# Patient Record
Sex: Male | Born: 1970 | Race: Black or African American | Hispanic: No | Smoking: Current every day smoker
Health system: Southern US, Community
[De-identification: ages and names within clinical notes are randomized; demographics above are authoritative.]

## PROBLEM LIST (undated history)

## (undated) DIAGNOSIS — L732 Hidradenitis suppurativa: Secondary | ICD-10-CM

## (undated) DIAGNOSIS — R079 Chest pain, unspecified: Secondary | ICD-10-CM

## (undated) DIAGNOSIS — I8390 Asymptomatic varicose veins of unspecified lower extremity: Secondary | ICD-10-CM

## (undated) DIAGNOSIS — M419 Scoliosis, unspecified: Secondary | ICD-10-CM

## (undated) DIAGNOSIS — F1910 Other psychoactive substance abuse, uncomplicated: Secondary | ICD-10-CM

## (undated) DIAGNOSIS — E162 Hypoglycemia, unspecified: Secondary | ICD-10-CM

## (undated) DIAGNOSIS — F1110 Opioid abuse, uncomplicated: Secondary | ICD-10-CM

## (undated) HISTORY — DX: Hidradenitis suppurativa: L73.2

## (undated) HISTORY — PX: OTHER SURGICAL HISTORY: SHX169

## (undated) HISTORY — DX: Scoliosis, unspecified: M41.9

## (undated) HISTORY — DX: Asymptomatic varicose veins of unspecified lower extremity: I83.90

---

## 2009-06-04 ENCOUNTER — Ambulatory Visit: Payer: Self-pay | Admitting: Radiology

## 2009-06-04 ENCOUNTER — Emergency Department (HOSPITAL_BASED_OUTPATIENT_CLINIC_OR_DEPARTMENT_OTHER): Admission: EM | Admit: 2009-06-04 | Discharge: 2009-06-04 | Payer: Self-pay | Admitting: Emergency Medicine

## 2011-10-28 ENCOUNTER — Encounter: Payer: Self-pay | Admitting: Internal Medicine

## 2011-12-05 ENCOUNTER — Encounter: Payer: Self-pay | Admitting: Internal Medicine

## 2012-05-07 ENCOUNTER — Ambulatory Visit: Payer: Self-pay

## 2012-05-07 ENCOUNTER — Ambulatory Visit (INDEPENDENT_AMBULATORY_CARE_PROVIDER_SITE_OTHER): Payer: PRIVATE HEALTH INSURANCE | Admitting: Internal Medicine

## 2012-05-07 ENCOUNTER — Encounter: Payer: Self-pay | Admitting: Internal Medicine

## 2012-05-07 VITALS — BP 123/73 | HR 76 | Temp 97.0°F | Ht 72.0 in | Wt 210.3 lb

## 2012-05-07 DIAGNOSIS — K029 Dental caries, unspecified: Secondary | ICD-10-CM

## 2012-05-07 DIAGNOSIS — M545 Low back pain, unspecified: Secondary | ICD-10-CM

## 2012-05-07 DIAGNOSIS — Z0000 Encounter for general adult medical examination without abnormal findings: Secondary | ICD-10-CM | POA: Insufficient documentation

## 2012-05-07 DIAGNOSIS — Z23 Encounter for immunization: Secondary | ICD-10-CM

## 2012-05-07 DIAGNOSIS — F17200 Nicotine dependence, unspecified, uncomplicated: Secondary | ICD-10-CM

## 2012-05-07 DIAGNOSIS — F17210 Nicotine dependence, cigarettes, uncomplicated: Secondary | ICD-10-CM | POA: Insufficient documentation

## 2012-05-07 DIAGNOSIS — M4120 Other idiopathic scoliosis, site unspecified: Secondary | ICD-10-CM

## 2012-05-07 MED ORDER — TRAMADOL HCL 50 MG PO TABS: 100.0000 mg | ORAL_TABLET | Freq: Four times a day (QID) | ORAL | Status: DC | PRN

## 2012-05-07 MED ORDER — OXYCODONE-ACETAMINOPHEN 10-325 MG PO TABS: 1.0000 | ORAL_TABLET | ORAL | Status: DC | PRN

## 2012-05-07 NOTE — Assessment & Plan Note (Signed)
  Assessment:  Progress toward smoking cessation:  smoking the same amount  Barriers to progress toward smoking cessation:  withdrawal symptoms  Comments:   Plan:  Instruction/counseling given:  I counseled patient on the dangers of tobacco use.  Educational resources provided:  smoking cessation handout (tips, strategies, fact sheets)  Self management tools provided:     Medications to assist with smoking cessation:  none prescibed today. Patient agreed to the following self-care plans for smoking cessation:  go to the [employer] ([email or web address])   Other: We will address this issues on his next visit.

## 2012-05-07 NOTE — Progress Notes (Signed)
Hurley ID: [name], male   DOB: 12-30-1970, 42 y.o.   MRN: [id number]  Subjective:   Hurley ID: Roger Hurley male   DOB: Aug 09, 1970 42 y.o.   MRN: [id number]  HPI: [name] is a 42 y.o. with past medical history only significant for scoliosis deformity of spine and cigarette smoking, presents to the clinic with back pain for over one year. He would also be interested in establishing care to this clinic.  He reports that his back pain is very severe, occurring several times in a week and associated with the nature of his work which involves lifting heavy objects. The Hurley however, denies symptoms of tingling, or weakness in his extremities. There are no symptoms of loss of fecal or urine control. The pain is dull, and when is worst is about an 8/10. His been using percocets 10 -325 mg about 10 tablets in a week for control his pain. He reports that his 'immune' to any other medications, including extra strength Tylenol, and NSAID's. It is unclear whether he has tried tramadol. He reports that as a teenager, he used to get back pains, which were thought to be related to his scoliosis and he underwent therapy by a chiropractic who informed him that his back had 'straightened up'. He actually reports that his back pain improved and he was discharged from therapy.   However, over the last 1 year he started working with a truck, which involved lifting heavy objects and his pain recurred. No specific history of trauma.    Past Medical History  Diagnosis Date  . Scoliosis deformity of spine     Since childhood   Current Outpatient Prescriptions  Medication Sig Dispense Refill  . oxyCODONE-acetaminophen (PERCOCET) 10-325 MG per tablet Take 1 tablet by mouth every 4 (four) hours as needed for pain.  30 tablet  0  . traMADol (ULTRAM) 50 MG tablet Take 2 tablets (100 mg total) by mouth every 6 (six) hours as needed for pain.  30 tablet  0   No current facility-administered medications  for this visit.   Family History  Problem Relation Age of Onset  . Diabetes Mother   . Hyperlipidemia Mother   . Hypertension Mother   . Diabetes Maternal Grandfather    History   Social History  . Marital Status: Legally Separated    Spouse Name: N/A    Number of Children: N/A  . 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 Smoking status: Current Every Day Smoker -- 0.50 packs/day    Types: Cigarettes  . Smokeless tobacco: Never Used     Comment: Has done PCP, Cocaine and Marijuana in the past.  . Alcohol Use: 3.6 oz/week    6 Cans of beer per week  . Drug Use: No  . Sexually Active: Yes     Comment: One girlfriend of 4 months. Use condoms for other relationships. Only with women   Other Topics Concern  . None   Social History Narrative   Divorced, three children of 50, 58 and 45 years of age in 2014.   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    Lives in his own apartment         Review of Systems: Constitutional: Denies fever, chills, diaphoresis, appetite change and fatigue.  HEENT: Denies photophobia, eye pain,  redness, hearing loss, ear pain, congestion, sore throat, rhinorrhea, sneezing, mouth sores, trouble swallowing, neck pain, neck stiffness and tinnitus.   Respiratory: Denies SOB, DOE, cough, chest tightness,  and wheezing.   Cardiovascular: Denies chest pain, palpitations and leg swelling.  Gastrointestinal: Denies nausea, vomiting, abdominal pain, diarrhea, constipation, blood in stool and abdominal distention.  Genitourinary: Denies dysuria, urgency, frequency, hematuria, flank pain and difficulty urinating.  Musculoskeletal: Denies myalgias, joint swelling, arthralgias and gait problem.  Skin: Denies pallor, rash and wound.  Neurological: Denies dizziness, seizures, syncope, weakness, light-headedness, numbness and headaches.  Hematological:  Denies adenopathy. Easy bruising, personal or family bleeding history  Psychiatric/Behavioral: Denies suicidal ideation, mood changes, confusion, nervousness, sleep disturbance and agitation  Objective:  Physical Exam: Filed Vitals:   05/07/12 1115  BP: 123/73  Pulse: 76  Temp: 97 F (36.1 C)  TempSrc: Oral  Height: 6' (1.829 m)  Weight: 210 lb 4.8 oz (95.391 kg)  SpO2: 100%   Constitutional: Vital signs reviewed.  Hurley is a well-developed and well-nourished in no acute distress and cooperative with exam. Alert and oriented x3.  Head: Normocephalic and atraumatic Ear: TM normal bilaterally Mouth: no erythema or exudates, MMM Eyes: PERRL, EOMI, conjunctivae normal, No scleral icterus.  Neck: Supple, Trachea midline normal ROM, No JVD, mass, thyromegaly, or carotid bruit present.  Cardiovascular: RRR, S1 normal, S2 normal, no MRG, pulses symmetric and intact bilaterally Pulmonary/Chest: CTAB, no wheezes, rales, or rhonchi Abdominal: Soft. Non-tender, non-distended, bowel sounds are normal, no masses, organomegaly, or guarding present.  GU: no CVA tenderness Musculoskeletal: There is evidence of scoliosis toward the right side of his back. No areas of tenderness. He is able to bend and touch his feet. No joint deformities, erythema, or stiffness, ROM full and no nontender Hematology: no cervical, inginal, or axillary adenopathy.  Neurological: A&O x3, Strength is normal and symmetric bilaterally, cranial nerve II-XII are grossly intact, no focal motor deficit, sensory intact to light touch bilaterally.  Skin: Warm, dry and intact. No rash, cyanosis, or clubbing.  he has tattoos on his extremities.  Psychiatric: Normal mood and affect. speech and behavior is normal. Judgment and thought content normal. Cognition and memory are normal.   Assessment & Plan:  I have discussed my assessment, and plan for the care of this Hurley with Dr. Dalphine Handing as detailed under in problem based  charting.  In the brief, his chronic back pain is very likely related to the nature of his work and scoliosis. This is his initial visit to this clinic. I have referred him to the physical therapy as outpatient and also given him a prescription of tramadol as a first-line treatment for his pain and 30 tablets of Percocet if the tramadol does not control his pain. I have counseled him about finding another job that will not affect his back pain. He will followup in one month.

## 2012-05-07 NOTE — Assessment & Plan Note (Addendum)
He reports sensitivity of his teeth to cold and sweet foods.  Plan. -Referral to dentist. He now has an orange card which gives him 60% discount.

## 2012-05-07 NOTE — Patient Instructions (Signed)
Please take Tramadol and if it does not help, then continue with Percocet We will refer you to Physical therapy for your low back pain  Please you are advised to change your job due to back pain  I will refer you to a dentist too Please come back to see me in one month and we see how you are doing

## 2012-05-07 NOTE — Assessment & Plan Note (Signed)
Updated his vaccinations. Ordered lipid panel as recommended for screening for hyperlipidemia.

## 2012-05-07 NOTE — Assessment & Plan Note (Signed)
His back pain, is chronic without red flags. Pain controlled with Percocet. The nature of his job exacerbates this pain.  Plan - I have discussed with the patient the need to change his job since his back pain is worsened by lifting heavy objects. - Referral physical therapy.  - We will start by trying tramadol 100 mg every 6 hours as needed for pain. Have also prescribed 30 tablets of Percocet 10 x 325 mg as needed for pain if the pain is not well controlled by tramadol. -Given his history of previous polysubstance abuse, I am hesitant to start him on long-term opioids. -consider referral to the pain clinic for further care.

## 2012-05-08 LAB — LIPID PANEL
HDL: 70 mg/dL (ref >39)
Total CHOL/HDL Ratio: 2.5 Ratio
Triglycerides: 51 mg/dL (ref <150)
VLDL: 10 mg/dL (ref 0–40)

## 2012-05-24 ENCOUNTER — Other Ambulatory Visit: Payer: Self-pay | Admitting: Internal Medicine

## 2012-05-24 ENCOUNTER — Other Ambulatory Visit: Payer: Self-pay | Admitting: *Deleted

## 2012-05-24 DIAGNOSIS — M545 Low back pain: Secondary | ICD-10-CM

## 2012-05-24 MED ORDER — OXYCODONE-ACETAMINOPHEN 10-325 MG PO TABS: 1.0000 | ORAL_TABLET | ORAL | Status: DC | PRN

## 2012-05-24 MED ORDER — TRAMADOL HCL 50 MG PO TABS: 100.0000 mg | ORAL_TABLET | Freq: Four times a day (QID) | ORAL | Status: DC | PRN

## 2012-05-24 NOTE — Progress Notes (Signed)
Patient called requesting for more prescription of percocet. He says that he only has two pills remaining. He is getting outpatient PT tomorrow at 8:30 am. I informed the patient that we need to try PT for his back pain and that I will not prescribe chronic narcotics at this time. I will initiate a referral to pain clinic as well. Patient verbalized understanding. I will see him at 06/09/2012.

## 2012-05-24 NOTE — Telephone Encounter (Signed)
Last refill of meds 2/21

## 2012-05-25 ENCOUNTER — Ambulatory Visit: Payer: PRIVATE HEALTH INSURANCE | Attending: Internal Medicine | Admitting: Physical Therapy

## 2012-05-25 DIAGNOSIS — M545 Low back pain, unspecified: Secondary | ICD-10-CM | POA: Insufficient documentation

## 2012-05-25 DIAGNOSIS — IMO0001 Reserved for inherently not codable concepts without codable children: Secondary | ICD-10-CM | POA: Insufficient documentation

## 2012-06-01 ENCOUNTER — Ambulatory Visit: Payer: PRIVATE HEALTH INSURANCE | Admitting: Physical Therapy

## 2012-06-09 ENCOUNTER — Encounter: Payer: Self-pay | Admitting: Internal Medicine

## 2012-06-09 ENCOUNTER — Encounter: Payer: Self-pay | Admitting: Physical Medicine & Rehabilitation

## 2012-06-09 ENCOUNTER — Ambulatory Visit (INDEPENDENT_AMBULATORY_CARE_PROVIDER_SITE_OTHER): Payer: PRIVATE HEALTH INSURANCE | Admitting: Internal Medicine

## 2012-06-09 VITALS — BP 152/73 | HR 73 | Temp 97.8°F | Ht 73.0 in | Wt 210.3 lb

## 2012-06-09 DIAGNOSIS — M545 Low back pain: Secondary | ICD-10-CM

## 2012-06-09 NOTE — Progress Notes (Signed)
Subjective:   Patient ID: Roger Hurley male   DOB: 06/21/1970 42 y.o.   MRN: [id number]  HPI: [name] is a 43 y.o. with past medical history only significant for scoliosis deformity of spine and cigarette smoking, presents for followup of his back pain. He reports that tramadol, which was prescribed during his last visit was unable to control his pain. He tried physical therapy 2 weeks ago and it did not help either. Actually he believes it made his pain worse. He requests for Percocet, so that he can go back to work. The pain is exacerbated by bending backward. He is unwilling to try any other medication besides Percocet since he has not received any relief in the past with his medications.  He has no other any complaints. I referred him to the pain clinic and he has an appointment on 08/13/2012. He also has an appointment in her outpatient physical therapy on 06/10/2012.   Past Medical History  Diagnosis Date  . Scoliosis deformity of spine     Since childhood   Current Outpatient Prescriptions  Medication Sig Dispense Refill  . traMADol (ULTRAM) 50 MG tablet Take 2 tablets (100 mg total) by mouth every 6 (six) hours as needed for pain.  30 tablet  0   No current facility-administered medications for this visit.   Family History  Problem Relation Age of Onset  . Diabetes Mother   . Hyperlipidemia Mother   . Hypertension Mother   . Diabetes Maternal Grandfather    History   Social History  . Marital Status: Divorced    Spouse Name: N/A    Number of Children: N/A  . 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 Smoking status: Current Every Day Smoker -- 0.50 packs/day    Types: Cigarettes  . Smokeless tobacco: Never Used     Comment: Has done PCP, Cocaine and Marijuana in the past.  . Alcohol Use: 3.6 oz/week    6 Cans of beer per week  . Drug Use: No  . Sexually Active: Yes      Comment: One girlfriend of 4 months. Use condoms for other relationships. Only with women   Other Topics Concern  . None   Social History Narrative   Divorced, three children of 52, 87 and 100 years of age in 2014.   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    Lives in his own apartment         Review of Systems: Constitutional: Denies fever, chills, diaphoresis, appetite change and fatigue.  HEENT: Denies photophobia, eye pain, redness, hearing loss, ear pain, congestion, sore throat, rhinorrhea, sneezing, mouth sores, trouble swallowing, neck pain, neck stiffness and tinnitus.   Respiratory: Denies SOB, DOE, cough, chest tightness,  and wheezing.   Cardiovascular: Denies chest pain, palpitations and leg swelling.  Gastrointestinal: Denies nausea, vomiting, abdominal pain, diarrhea, constipation, blood in stool and abdominal distention.  Genitourinary: Denies dysuria, urgency, frequency, hematuria, flank pain and difficulty urinating.  Musculoskeletal: Denies myalgias, joint swelling, arthralgias and gait problem.  Skin: Denies pallor, rash and wound.  Neurological: Denies dizziness, seizures, syncope, weakness, light-headedness, numbness and headaches.  Hematological: Denies adenopathy. Easy bruising, personal or family bleeding history  Psychiatric/Behavioral: Denies suicidal ideation, mood changes, confusion, nervousness, sleep disturbance and agitation  Objective:  Physical Exam: Filed Vitals:  06/09/12 1439  BP: 152/73  Pulse: 73  Temp: 97.8 F (36.6 C)  TempSrc: Oral  Height: 6\' 1"  (1.854 m)  Weight: 210 lb 4.8 oz (95.391 kg)  SpO2: 100%   Constitutional: Vital signs reviewed.  Patient is a well-developed and well-nourished in no acute distress and cooperative with exam. Alert and oriented x3.  Head: Normocephalic and atraumatic Ear: TM normal bilaterally Mouth: no erythema or exudates, MMM Eyes: PERRL, EOMI, conjunctivae normal, No scleral  icterus.  Neck: Supple, Trachea midline normal ROM, No JVD, mass, thyromegaly, or carotid bruit present.  Cardiovascular: RRR, S1 normal, S2 normal, no MRG, pulses symmetric and intact bilaterally Pulmonary/Chest: CTAB, no wheezes, rales, or rhonchi Abdominal: Soft. Non-tender, non-distended, bowel sounds are normal, no masses, organomegaly, or guarding present.  GU: no CVA tenderness Musculoskeletal: There is evidence of scoliosis toward the right side of his back. There is an area of tenderness over the right paraspinal muscles around the level T10-L4. The same area, also demonstrates muscle tightness. He is able to bend and touch his feet. No joint deformities, erythema, or stiffness, ROM full and no nontender Hematology: no cervical, inginal, or axillary adenopathy.  Neurological: A&O x3, Strength is normal and symmetric bilaterally, cranial nerve II-XII are grossly intact, no focal motor deficit, sensory intact to light touch bilaterally.  Skin: Warm, dry and intact. No rash, cyanosis, or clubbing.  he has tattoos on his extremities.  Psychiatric: Normal mood and affect. speech and behavior is normal. Judgment and thought content normal. Cognition and memory are normal.   Assessment & Plan:  I have discussed my assessment, and plan for the care of this patient with Dr. Josem Kaufmann, who personally evaluated the patient.     Several options for pain management were explored. He was offered a muscle relaxant, methocarbamol 500 mg by mouth every 8 hours as needed for pain in addition to salicylate 1500 mg every 12 hours as needed. This regimen was likely to help since his back pain is related to muscle cramps and rigidity in the setting of scoliosis. He was also informed that the next step if this does not help is injections with pain medication into his back. He was informed that this treatment plan is better than Percocet given the nature his pain. At that point, he became very upset and he declined  this plan. He demanded for percocet. He became verbally abusive, and left the clinic while cursing the medical staff including me and Dr Josem Kaufmann.

## 2012-06-09 NOTE — Progress Notes (Signed)
I interviewed and examined Roger Hurley with Dr. Zada Girt and confirmed his history of present illness.  My examination was notable for left (not right) lumbar paraspinous muscle tightness and pain.  His right lumbar paraspinous muscles were not tight or tender to palpation.  Given his history and his physical examination it was felt that he had acute left lumbar paraspinous muscle spasms likely related to his chronic scoliosis and possible underlying facet arthropathy.  The therapy of choice in this situation is to treat the active muscle spasms with a muscle relaxant and the facet arthropathy with an anti-inflammatory which he has not yet tried as he claims other NSAIDs have been ineffective for him.  He was not interested in that therapy (methocarbomol and salsalate) and demanded percocet.  I told him that the percocet was not appropriate therapy for his underlying spasm and inflammation at which point he became verbally belligerent to myself and Dr. Zada Girt.  We again reiterated that we were offering medication to treat his underlying disorder and he remained uninterested.  He left the clinic yelling down the hall and did not take the clinic director up on his offer to speak with him about his concerns.  I am concerned that Roger Hurley is not an appropriate patient for our clinic given his demonstrated verbally abusive behavior when his demands for narcotics are not met.  I do not feel we will be able to form a therapeutic relationship that will allow [place] to appropriately manage Roger Hurley's back pain.  I have asked the medical director to proceed with a formal letter to Roger Hurley notifying him of the need to find another Primary Care Provider within the next thirty days.  We will continue to provide him with appropriate medical care for the next thirty days while he looks for continuing care.  I will also discuss this with the clinic medical director.

## 2012-06-10 ENCOUNTER — Encounter: Payer: Self-pay | Admitting: Internal Medicine

## 2012-06-10 ENCOUNTER — Ambulatory Visit: Payer: PRIVATE HEALTH INSURANCE | Admitting: Physical Therapy

## 2012-06-15 ENCOUNTER — Ambulatory Visit: Payer: PRIVATE HEALTH INSURANCE | Admitting: Physical Therapy

## 2012-06-22 ENCOUNTER — Encounter: Payer: PRIVATE HEALTH INSURANCE | Admitting: Physical Therapy

## 2012-06-25 ENCOUNTER — Encounter: Payer: Self-pay | Admitting: Internal Medicine

## 2012-07-01 ENCOUNTER — Inpatient Hospital Stay (HOSPITAL_BASED_OUTPATIENT_CLINIC_OR_DEPARTMENT_OTHER)
Admission: EM | Admit: 2012-07-01 | Discharge: 2012-07-02 | DRG: 918 | Disposition: A | Discharge status: Home / Self Care | Payer: PRIVATE HEALTH INSURANCE | Attending: Internal Medicine | Admitting: Internal Medicine

## 2012-07-01 ENCOUNTER — Encounter (HOSPITAL_BASED_OUTPATIENT_CLINIC_OR_DEPARTMENT_OTHER): Payer: Self-pay | Admitting: Emergency Medicine

## 2012-07-01 ENCOUNTER — Emergency Department (HOSPITAL_BASED_OUTPATIENT_CLINIC_OR_DEPARTMENT_OTHER): Payer: PRIVATE HEALTH INSURANCE

## 2012-07-01 DIAGNOSIS — M549 Dorsalgia, unspecified: Secondary | ICD-10-CM

## 2012-07-01 DIAGNOSIS — M4120 Other idiopathic scoliosis, site unspecified: Secondary | ICD-10-CM | POA: Diagnosis present

## 2012-07-01 DIAGNOSIS — F1910 Other psychoactive substance abuse, uncomplicated: Secondary | ICD-10-CM

## 2012-07-01 DIAGNOSIS — T405X1A Poisoning by cocaine, accidental (unintentional), initial encounter: Principal | ICD-10-CM | POA: Diagnosis present

## 2012-07-01 DIAGNOSIS — F17200 Nicotine dependence, unspecified, uncomplicated: Secondary | ICD-10-CM | POA: Diagnosis present

## 2012-07-01 DIAGNOSIS — E162 Hypoglycemia, unspecified: Secondary | ICD-10-CM | POA: Diagnosis present

## 2012-07-01 DIAGNOSIS — R0789 Other chest pain: Secondary | ICD-10-CM | POA: Diagnosis present

## 2012-07-01 DIAGNOSIS — R079 Chest pain, unspecified: Secondary | ICD-10-CM | POA: Diagnosis present

## 2012-07-01 DIAGNOSIS — R001 Bradycardia, unspecified: Secondary | ICD-10-CM | POA: Diagnosis present

## 2012-07-01 DIAGNOSIS — I498 Other specified cardiac arrhythmias: Secondary | ICD-10-CM

## 2012-07-01 DIAGNOSIS — G8929 Other chronic pain: Secondary | ICD-10-CM

## 2012-07-01 HISTORY — DX: Hypoglycemia, unspecified: E16.2

## 2012-07-01 HISTORY — DX: Chest pain, unspecified: R07.9

## 2012-07-01 LAB — COMPREHENSIVE METABOLIC PANEL
BUN: 15 mg/dL (ref 6–23)
CO2: 29 mEq/L (ref 19–32)
Calcium: 9.8 mg/dL (ref 8.4–10.5)
GFR calc Af Amer: 79 mL/min — ABNORMAL LOW (ref >90)
GFR calc non Af Amer: 68 mL/min — ABNORMAL LOW (ref >90)
Glucose, Bld: 98 mg/dL (ref 70–99)
Total Protein: 7.3 g/dL (ref 6.0–8.3)

## 2012-07-01 LAB — URINALYSIS, ROUTINE W REFLEX MICROSCOPIC
Bilirubin Urine: NEGATIVE
Glucose, UA: NEGATIVE mg/dL
Ketones, ur: NEGATIVE mg/dL
Leukocytes, UA: NEGATIVE
Nitrite: NEGATIVE
Specific Gravity, Urine: 1.035 — ABNORMAL HIGH (ref 1.005–1.030)
pH: 6 (ref 5.0–8.0)

## 2012-07-01 LAB — D-DIMER, QUANTITATIVE: D-Dimer, Quant: <0.27 ug/mL-FEU (ref 0.00–0.48)

## 2012-07-01 LAB — CBC
Hemoglobin: 14.6 g/dL (ref 13.0–17.0)
MCHC: 35.1 g/dL (ref 30.0–36.0)
Platelets: 210 10*3/uL (ref 150–400)
RDW: 12.8 % (ref 11.5–15.5)
WBC: 8.3 10*3/uL (ref 4.0–10.5)

## 2012-07-01 MED ORDER — KETOROLAC TROMETHAMINE 30 MG/ML IJ SOLN
30.0000 mg | Freq: Once | INTRAMUSCULAR | Status: AC
  Administered 2012-07-02: 30 mg via INTRAVENOUS
  Filled 2012-07-01: qty 1

## 2012-07-01 MED ORDER — ASPIRIN 325 MG PO TABS
325.0000 mg | ORAL_TABLET | Freq: Once | ORAL | Status: AC
  Administered 2012-07-02: 325 mg via ORAL
  Filled 2012-07-01: qty 1

## 2012-07-01 NOTE — ED Notes (Signed)
Upper mid chest pain since 0730. Worse with movement. Denies radiation.  SOB.  Worse when blowing nose.  Has seasonal allergies.

## 2012-07-01 NOTE — ED Notes (Signed)
MD at bedside. 

## 2012-07-02 ENCOUNTER — Encounter (HOSPITAL_BASED_OUTPATIENT_CLINIC_OR_DEPARTMENT_OTHER): Payer: Self-pay | Admitting: Emergency Medicine

## 2012-07-02 DIAGNOSIS — R079 Chest pain, unspecified: Secondary | ICD-10-CM

## 2012-07-02 DIAGNOSIS — I379 Nonrheumatic pulmonary valve disorder, unspecified: Secondary | ICD-10-CM

## 2012-07-02 DIAGNOSIS — R001 Bradycardia, unspecified: Secondary | ICD-10-CM | POA: Diagnosis present

## 2012-07-02 DIAGNOSIS — M549 Dorsalgia, unspecified: Secondary | ICD-10-CM | POA: Diagnosis present

## 2012-07-02 DIAGNOSIS — F1910 Other psychoactive substance abuse, uncomplicated: Secondary | ICD-10-CM | POA: Diagnosis present

## 2012-07-02 LAB — MAGNESIUM: Magnesium: 2.3 mg/dL (ref 1.5–2.5)

## 2012-07-02 LAB — BASIC METABOLIC PANEL
BUN: 14 mg/dL (ref 6–23)
Calcium: 9.4 mg/dL (ref 8.4–10.5)
Chloride: 102 mEq/L (ref 96–112)
GFR calc Af Amer: 76 mL/min — ABNORMAL LOW (ref >90)
GFR calc non Af Amer: 65 mL/min — ABNORMAL LOW (ref >90)
Glucose, Bld: 91 mg/dL (ref 70–99)
Potassium: 4 mEq/L (ref 3.5–5.1)
Sodium: 137 mEq/L (ref 135–145)

## 2012-07-02 LAB — RAPID URINE DRUG SCREEN, HOSP PERFORMED
Benzodiazepines: NOT DETECTED
Cocaine: POSITIVE — AB
Opiates: NOT DETECTED

## 2012-07-02 LAB — PROTIME-INR
INR: 1 (ref 0.00–1.49)
Prothrombin Time: 13.1 seconds (ref 11.6–15.2)

## 2012-07-02 LAB — HEMOGLOBIN A1C: Mean Plasma Glucose: 117 mg/dL — ABNORMAL HIGH (ref <117)

## 2012-07-02 LAB — TSH: TSH: 0.669 u[IU]/mL (ref 0.350–4.500)

## 2012-07-02 MED ORDER — ACETAMINOPHEN 325 MG PO TABS: 650.0000 mg | ORAL_TABLET | Freq: Four times a day (QID) | ORAL | Status: DC | PRN

## 2012-07-02 MED ORDER — VITAMIN B-1 100 MG PO TABS
100.0000 mg | ORAL_TABLET | Freq: Every day | ORAL | Status: DC
  Administered 2012-07-02: 100 mg via ORAL
  Filled 2012-07-02: qty 1

## 2012-07-02 MED ORDER — SODIUM CHLORIDE 0.9 % IJ SOLN
3.0000 mL | Freq: Two times a day (BID) | INTRAMUSCULAR | Status: DC
Start: 2012-07-02 — End: 2012-07-02

## 2012-07-02 MED ORDER — SODIUM CHLORIDE 0.9 % IJ SOLN: 3.0000 mL | INTRAMUSCULAR | Status: DC | PRN

## 2012-07-02 MED ORDER — FOLIC ACID 1 MG PO TABS
1.0000 mg | ORAL_TABLET | Freq: Every day | ORAL | Status: DC
  Administered 2012-07-02: 1 mg via ORAL
  Filled 2012-07-02 (×2): qty 1

## 2012-07-02 MED ORDER — SODIUM CHLORIDE 0.9 % IJ SOLN: 3.0000 mL | Freq: Two times a day (BID) | INTRAMUSCULAR | Status: DC

## 2012-07-02 MED ORDER — MORPHINE SULFATE 2 MG/ML IJ SOLN: 2.0000 mg | INTRAMUSCULAR | Status: DC | PRN

## 2012-07-02 MED ORDER — SODIUM CHLORIDE 0.9 % IV SOLN: 250.0000 mL | INTRAVENOUS | Status: DC | PRN

## 2012-07-02 MED ORDER — TRAMADOL HCL 50 MG PO TABS: 100.0000 mg | ORAL_TABLET | Freq: Four times a day (QID) | ORAL | Status: DC | PRN

## 2012-07-02 MED ORDER — ASPIRIN EC 81 MG PO TBEC
81.0000 mg | DELAYED_RELEASE_TABLET | Freq: Every day | ORAL | Status: DC
  Administered 2012-07-02: 81 mg via ORAL
  Filled 2012-07-02: qty 1

## 2012-07-02 MED ORDER — LORAZEPAM 2 MG/ML IJ SOLN
1.0000 mg | Freq: Once | INTRAMUSCULAR | Status: AC
  Administered 2012-07-02: 1 mg via INTRAVENOUS
  Filled 2012-07-02: qty 1

## 2012-07-02 MED ORDER — HEPARIN SODIUM (PORCINE) 5000 UNIT/ML IJ SOLN
5000.0000 [IU] | Freq: Three times a day (TID) | INTRAMUSCULAR | Status: DC
  Administered 2012-07-02: 5000 [IU] via SUBCUTANEOUS
  Filled 2012-07-02 (×4): qty 1

## 2012-07-02 MED ORDER — ACETAMINOPHEN 650 MG RE SUPP: 650.0000 mg | Freq: Four times a day (QID) | RECTAL | Status: DC | PRN

## 2012-07-02 MED ORDER — LORAZEPAM 2 MG/ML IJ SOLN: 0.5000 mg | Freq: Four times a day (QID) | INTRAMUSCULAR | Status: DC | PRN

## 2012-07-02 MED ORDER — NITROGLYCERIN 0.4 MG SL SUBL: 0.4000 mg | SUBLINGUAL_TABLET | SUBLINGUAL | Status: DC | PRN

## 2012-07-02 MED ORDER — SODIUM CHLORIDE 0.9 % IV SOLN
INTRAVENOUS | Status: DC
  Administered 2012-07-02: 06:00:00 via INTRAVENOUS

## 2012-07-02 MED ORDER — THIAMINE HCL 100 MG PO TABS: 100.0000 mg | ORAL_TABLET | Freq: Every day | ORAL | Status: DC

## 2012-07-02 NOTE — ED Notes (Signed)
Attempted to call report to floor, RN unavailable, number left for return call

## 2012-07-02 NOTE — Progress Notes (Signed)
*  PRELIMINARY RESULTS* Echocardiogram 2D Echocardiogram has been performed.  Roger Hurley 07/02/2012, 1:40 PM

## 2012-07-02 NOTE — Progress Notes (Signed)
D/c orders received;IV removed with gauze on, pt remains in stable condition, pt meds and instructions reviewed and given to pt; pt d/c to home 
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
## 2012-07-02 NOTE — Progress Notes (Signed)
Utilization review completed. Scottlyn Mchaney, RN, BSN. 

## 2012-07-02 NOTE — ED Notes (Signed)
ekg repeated per md, ekg given to md

## 2012-07-02 NOTE — H&P (Signed)
Internal Medicine Attending Admission Note Date: 07/02/2012  Patient name: Roger Hurley Medical record number: [id number] Date of birth: 11/09/1970 Age: 42 y.o. Gender: male  I saw and evaluated the patient. I reviewed the resident's note and I agree with the resident's findings and plan as documented in the resident's note.  Chief Complaint(s): Chest pain  History - key components related to admission: Patient is a 42 year old man with past medical history most significant for ongoing substance abuse comes in with chief complaints of chest pain. The description of chest pain, aggravating and relieving factors makes it atypical for coronary artery disease related angina.   Patient is lethargic but denies any shortness of breath, dizziness, syncope, presyncope at this time. He is eating lunch at the time of my visit and denies any ongoing chest pain. Patient admits that he used cocaine 2 days ago.  15 point review of system is negative except what is noted above.  Physical Exam - key components related to admission:  Filed Vitals:   07/02/12 0911 07/02/12 0914 07/02/12 0917 07/02/12 0920  BP: 137/79 125/73 142/81 127/86  Pulse: 58 62 82 72  Temp:      TempSrc:      Resp:      Height:      Weight:      SpO2: 94% 98% 98% 98%  Physical Exam: General: Vital signs reviewed and noted. Well-developed, well-nourished, in no acute distress; alert, appropriate and cooperative throughout examination.  Head: Normocephalic, atraumatic.  Eyes: PERRL, EOMI, No signs of anemia or jaundince.  Nose: Mucous membranes moist, not inflammed, nonerythematous.  Throat: Oropharynx nonerythematous, no exudate appreciated.   Neck: No deformities, masses, or tenderness noted.Supple, No carotid Bruits, no JVD.  Lungs:  Normal respiratory effort. Clear to auscultation BL without crackles or wheezes.  Heart: RRR. S1 and S2 normal without gallop, murmur, or rubs.  Abdomen:  BS normoactive. Soft, Nondistended,  non-tender.  No masses or organomegaly.  Extremities: No pretibial edema.  Neurologic: A&O X3, CN II - XII are grossly intact. Motor strength is 5/5 in the all 4 extremities, Sensations intact to light touch, Cerebellar signs negative.  Skin: No visible rashes, scars.     Lab results:   Basic Metabolic Panel:  Recent Labs  78/29/56 2305 07/02/12 0615  NA 138 137  K 4.3 4.0  CL 100 102  CO2 29 30  GLUCOSE 98 91  BUN 15 14  CREATININE 1.30 1.34  CALCIUM 9.8 9.4  MG  --  2.3  PHOS  --  4.0   Liver Function Tests:  Recent Labs  07/01/12 2305  AST 31  ALT 16  ALKPHOS 64  BILITOT 0.2*  PROT 7.3  ALBUMIN 3.8   CBC:  Recent Labs  07/01/12 2305  WBC 8.3  HGB 14.6  HCT 41.6  MCV 91.2  PLT 210   Cardiac Enzymes:  Recent Labs  07/01/12 2305 07/02/12 0243 07/02/12 0752  CKTOTAL  --  881*  --   CKMB  --  5.5*  --   TROPONINI <0.30 <0.30 <0.30   D-Dimer:  Recent Labs  07/01/12 2305  DDIMER <0.27   Coagulation:  Recent Labs  07/02/12 0615  INR 1.00    Imaging results:  Dg Chest [facility] 1 View  07/01/2012  *RADIOLOGY REPORT*  Clinical Data: Chest pain.  PORTABLE CHEST - 1 VIEW  Comparison: None.  Findings: Heart size and pulmonary vascularity are normal and the lungs are clear.  Moderately severe thoracic scoliosis.  No acute osseous abnormality.  IMPRESSION: No acute abnormality.  Moderately severe thoracic scoliosis.   Original Report Authenticated By: Francene Boyers, M.D.     Other results: EKG: Sinus bradycardia with no ST and T-wave changes  Assessment & Plan by Problem:  Principal Problem:   Chest pain Active Problems:   Bradycardia   Polysubstance abuse   Chronic back pain  Patient is a 42 year old man with past medical history most significant for substance abuse who comes in with acute onset chest pain which is atypical in character. Chest pain is most likely related to cocaine. Patient tested positive for cocaine on urine drug screen  during this admission. Chest pain could also be musculoskeletal in origin. Management of cocaine-induced chest pain includes benzodiazepines and calcium channel blockers(not given as patient has bradycardia).   Patient has asymptomatic bradycardia at this time in the setting of ongoing cocaine use. There have been multiple case reports in the literature regarding cocaine induced bradyarrhythmias due to conduction defects caused by chronic cocaine use. Patient will be advised to discontinue cocaine use to minimize destruction of his electrical conduction system from it.   Cardiology was consulted overnight and we appreciate their help.  2-D echocardiogram is pending at this time. If 2-D echocardiogram is normal, patient will be discharged home without need of any further workup as per cardiology recommendations.   Lars Mage MD Faculty-Internal Medicine Residency Program

## 2012-07-02 NOTE — Discharge Summary (Signed)
Internal Medicine Teaching [facility] Discharge Note  Name: Roger Hurley MRN: [id number] DOB: [birth year] 42 y.o.  Date of Admission: 07/01/2012 10:59 PM Date of Discharge: 07/02/2012 Attending Physician: Burns Spain, MD  Discharge Diagnosis: Principal Problem:   Chest pain Active Problems:   Bradycardia   Polysubstance abuse   Chronic back pain   Discharge Medications:   Medication List    ASK your doctor about these medications       oxyCODONE-acetaminophen 10-325 MG per tablet  Commonly known as:  PERCOCET  Take 2 tablets by mouth every 4 (four) hours as needed for pain.        Disposition and follow-up:   Roger Hurley was discharged from [facility] in Stable condition.  At the hospital follow up visit please address the following:  -cocaine abuse counseling -follow up chest pain -follow up bradycardia and ask about symptoms   Follow-up Appointments:  Discharge Orders   Future Appointments Provider Department Dept Phone   08/13/2012 11:30 AM Erick Colace, MD [phone]   Future Orders Complete By Expires     Diet - low sodium heart healthy  As directed     Discharge instructions  As directed     Scheduling Instructions:      Discharge patient to home when he is done getting his ECHO study. Thanks    Increase activity slowly  As directed        Consultations: Treatment Team:  Rounding Lbcardiology, MD  Procedures Performed:  Dg Chest Port 1 View  07/01/2012  *RADIOLOGY REPORT*  Clinical Data: Chest pain.  PORTABLE CHEST - 1 VIEW  Comparison: None.  Findings: Heart size and pulmonary vascularity are normal and the lungs are clear.  Moderately severe thoracic scoliosis.  No acute osseous abnormality.  IMPRESSION: No acute abnormality.  Moderately severe thoracic scoliosis.   Original Report Authenticated By: Francene Boyers, M.D.     2D Echo:  Transthoracic  Echocardiography  Patient: Roger, Hurley MR #: [id number] Study Date: 07/02/2012  Study Conclusions  - Left ventricle: The cavity size was normal. Wall thickness was increased in a pattern of mild LVH. Systolic function was normal. The estimated ejection fraction was in the range of 60% to 65%. Wall motion was normal; there were no regional wall motion abnormalities. The study is not technically sufficient to allow evaluation of LV diastolic function. - Atrial septum: No defect or patent foramen ovale was identified.  Admission HPI: 42 y.o PMH scoliosis, hypoglycemia presented to [facility] with new onset chest pain starting at 7 or 730 am on 07/01/12. Chest pain is worse with movement and blowing his nose and associated with shortness of breath, sweating, and dizziness which all associated symptoms have resolved. Chest pain/tightness was centrally located radiating to entire chest and feels like it is traveling toward his back. Sensation is like something sitting on his chest. Chest pain level varied 4-8/10. Chest pain is still present on admission. Nothing made better except medications given at outside [facility] Toradol 30 mg iv x 1, Ativan 1 mg iv and and Asprin 325mg . At [facility] BP was 146/65 HR 80 RR 18 100% on room air with bradycardiac down to 55. He had a total of 3 EKG there. Initial EKG sinus bradycardiac T wave inversion AVL, peaked anterolateral T waves. Second EKG Sinus bradycardia T wave inversion in V2, peaked T wave in lateral leads, Q wave in V2. Third  EKG NSR peaked T waves lateral leads and AVF, T wave inversion in V2, Q wave in V2.    Hospital Course by problem list: Principal Problem:   Chest pain Active Problems:   Bradycardia   Polysubstance abuse   Chronic back pain   Chest pain in the setting of cocaine use  Patient presented with atypical chest pain shortly after using cocaine. Vital signs were stable on admission. Chest x-ray  was negative for acute changes, d-dimer checked in the ED was negative. EKG was significant for sinus bradycardia without acute ST elevations or depressions, no T wave inversions. Troponins were cycled and were negative. Patient was given nitroglycerin, benzodiazepine, morphine, oxygen, aspirin. UDS positive for cocaine, therefore, beta blockers were avoided. Calcium channel blockers were also avoided in the setting of bradycardia. 2-D echo was performed which showed mild LVH pattern thickening, EF 65%, no regional wall motion abnormalities, poor diastolic evaluation. Cardiology was consulted and evaluated the patient. Bradycardia was thought to represent vagal tone and his chest pain was thought to be musculoskeletal in origin versus cocaine related. No further workup was felt to be needed at this time.  Sinus Bradycardia Sinus bradycardia noted on his EKG. Patient is asymptomatic and cardiology felt that no further workup is necessary. Continue to monitor this and followup as an outpatient.   Discharge Vitals:  BP 127/86  Pulse 72  Temp(Src) 97.4 F (36.3 C) (Oral)  Resp 18  Ht 6\' 1"  (1.854 m)  Wt 203 lb 1.6 oz (92.126 kg)  BMI 26.8 kg/m2  SpO2 98%  Discharge Labs:  Results for orders placed during the hospital encounter of 07/01/12 (from the past 24 hour(s))  COMPREHENSIVE METABOLIC PANEL     Status: Abnormal   Collection Time    07/01/12 11:05 PM      Result Value Range   Sodium 138  135 - 145 mEq/L   Potassium 4.3  3.5 - 5.1 mEq/L   Chloride 100  96 - 112 mEq/L   CO2 29  19 - 32 mEq/L   Glucose, Bld 98  70 - 99 mg/dL   BUN 15  6 - 23 mg/dL   Creatinine, Ser 4.78  0.50 - 1.35 mg/dL   Calcium 9.8  8.4 - 29.5 mg/dL   Total Protein 7.3  6.0 - 8.3 g/dL   Albumin 3.8  3.5 - 5.2 g/dL   AST 31  0 - 37 U/L   ALT 16  0 - 53 U/L   Alkaline Phosphatase 64  39 - 117 U/L   Total Bilirubin 0.2 (*) 0.3 - 1.2 mg/dL   GFR calc non Af Amer 68 (*) >90 mL/min   GFR calc Af Amer 79 (*) >90  mL/min  CBC     Status: None   Collection Time    07/01/12 11:05 PM      Result Value Range   WBC 8.3  4.0 - 10.5 K/uL   RBC 4.56  4.22 - 5.81 MIL/uL   Hemoglobin 14.6  13.0 - 17.0 g/dL   HCT 62.1  30.8 - 65.7 %   MCV 91.2  78.0 - 100.0 fL   MCH 32.0  26.0 - 34.0 pg   MCHC 35.1  30.0 - 36.0 g/dL   RDW 84.6  96.2 - 95.2 %   Platelets 210  150 - 400 K/uL  TROPONIN I     Status: None   Collection Time    07/01/12 11:05 PM      Result  Value Range   Troponin I <0.30  <0.30 ng/mL  D-DIMER, QUANTITATIVE     Status: None   Collection Time    07/01/12 11:05 PM      Result Value Range   D-Dimer, Quant <0.27  0.00 - 0.48 ug/mL-FEU  URINALYSIS, ROUTINE W REFLEX MICROSCOPIC     Status: Abnormal   Collection Time    07/01/12 11:34 PM      Result Value Range   Color, Urine YELLOW  YELLOW   APPearance CLEAR  CLEAR   Specific Gravity, Urine 1.035 (*) 1.005 - 1.030   pH 6.0  5.0 - 8.0   Glucose, UA NEGATIVE  NEGATIVE mg/dL   Hgb urine dipstick NEGATIVE  NEGATIVE   Bilirubin Urine NEGATIVE  NEGATIVE   Ketones, ur NEGATIVE  NEGATIVE mg/dL   Protein, ur NEGATIVE  NEGATIVE mg/dL   Urobilinogen, UA 0.2  0.0 - 1.0 mg/dL   Nitrite NEGATIVE  NEGATIVE   Leukocytes, UA NEGATIVE  NEGATIVE  URINE RAPID DRUG SCREEN (HOSP PERFORMED)     Status: Abnormal   Collection Time    07/01/12 11:35 PM      Result Value Range   Opiates NONE DETECTED  NONE DETECTED   Cocaine POSITIVE (*) NONE DETECTED   Benzodiazepines NONE DETECTED  NONE DETECTED   Amphetamines NONE DETECTED  NONE DETECTED   Tetrahydrocannabinol NONE DETECTED  NONE DETECTED   Barbiturates NONE DETECTED  NONE DETECTED  TROPONIN I     Status: None   Collection Time    07/02/12  2:43 AM      Result Value Range   Troponin I <0.30  <0.30 ng/mL  CK TOTAL AND CKMB     Status: Abnormal   Collection Time    07/02/12  2:43 AM      Result Value Range   Total CK 881 (*) 7 - 232 U/L   CK, MB 5.5 (*) 0.3 - 4.0 ng/mL   Relative Index 0.6  0.0 -  2.5  BASIC METABOLIC PANEL     Status: Abnormal   Collection Time    07/02/12  6:15 AM      Result Value Range   Sodium 137  135 - 145 mEq/L   Potassium 4.0  3.5 - 5.1 mEq/L   Chloride 102  96 - 112 mEq/L   CO2 30  19 - 32 mEq/L   Glucose, Bld 91  70 - 99 mg/dL   BUN 14  6 - 23 mg/dL   Creatinine, Ser 4.54  0.50 - 1.35 mg/dL   Calcium 9.4  8.4 - 09.8 mg/dL   GFR calc non Af Amer 65 (*) >90 mL/min   GFR calc Af Amer 76 (*) >90 mL/min  MAGNESIUM     Status: None   Collection Time    07/02/12  6:15 AM      Result Value Range   Magnesium 2.3  1.5 - 2.5 mg/dL  PHOSPHORUS     Status: None   Collection Time    07/02/12  6:15 AM      Result Value Range   Phosphorus 4.0  2.3 - 4.6 mg/dL  PROTIME-INR     Status: None   Collection Time    07/02/12  6:15 AM      Result Value Range   Prothrombin Time 13.1  11.6 - 15.2 seconds   INR 1.00  0.00 - 1.49  TSH     Status: None   Collection Time  07/02/12  6:15 AM      Result Value Range   TSH 0.669  0.350 - 4.500 uIU/mL  HEMOGLOBIN A1C     Status: Abnormal   Collection Time    07/02/12  6:15 AM      Result Value Range   Hemoglobin A1C 5.7 (*) <5.7 %   Mean Plasma Glucose 117 (*) <117 mg/dL  TROPONIN I     Status: None   Collection Time    07/02/12  7:52 AM      Result Value Range   Troponin I <0.30  <0.30 ng/mL    Signed: Denton Ar 07/02/2012, 1:07 PM   Time Spent on Discharge: 25 minutes Services Ordered on Discharge: none Equipment Ordered on Discharge: none
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
## 2012-07-02 NOTE — ED Notes (Signed)
Patient transported to X-ray, pt moving both extremities equally while being transported to xray

## 2012-07-02 NOTE — ED Notes (Signed)
Report given to [facility] 
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
## 2012-08-13 ENCOUNTER — Ambulatory Visit: Payer: PRIVATE HEALTH INSURANCE | Admitting: Physical Medicine & Rehabilitation

## 2012-08-30 ENCOUNTER — Ambulatory Visit: Payer: PRIVATE HEALTH INSURANCE | Admitting: Physical Medicine & Rehabilitation

## 2012-08-30 ENCOUNTER — Ambulatory Visit: Payer: PRIVATE HEALTH INSURANCE

## 2012-10-22 ENCOUNTER — Ambulatory Visit: Payer: PRIVATE HEALTH INSURANCE | Admitting: Physical Medicine & Rehabilitation

## 2012-10-22 NOTE — Addendum Note (Signed)
Addended by: Neomia Dear on: 10/22/2012 03:01 PM   Modules accepted: Orders

## 2012-11-22 ENCOUNTER — Ambulatory Visit: Payer: Self-pay

## 2012-12-06 ENCOUNTER — Ambulatory Visit: Payer: Self-pay

## 2013-02-12 ENCOUNTER — Emergency Department (HOSPITAL_BASED_OUTPATIENT_CLINIC_OR_DEPARTMENT_OTHER)
Admission: EM | Admit: 2013-02-12 | Discharge: 2013-02-12 | Disposition: A | Discharge status: Home / Self Care | Payer: Self-pay | Attending: Emergency Medicine | Admitting: Emergency Medicine

## 2013-02-12 ENCOUNTER — Encounter (HOSPITAL_BASED_OUTPATIENT_CLINIC_OR_DEPARTMENT_OTHER): Payer: Self-pay | Admitting: Emergency Medicine

## 2013-02-12 ENCOUNTER — Emergency Department (HOSPITAL_BASED_OUTPATIENT_CLINIC_OR_DEPARTMENT_OTHER): Payer: Self-pay

## 2013-02-12 DIAGNOSIS — Y939 Activity, unspecified: Secondary | ICD-10-CM | POA: Insufficient documentation

## 2013-02-12 DIAGNOSIS — Z862 Personal history of diseases of the blood and blood-forming organs and certain disorders involving the immune mechanism: Secondary | ICD-10-CM | POA: Insufficient documentation

## 2013-02-12 DIAGNOSIS — F17200 Nicotine dependence, unspecified, uncomplicated: Secondary | ICD-10-CM | POA: Insufficient documentation

## 2013-02-12 DIAGNOSIS — W230XXA Caught, crushed, jammed, or pinched between moving objects, initial encounter: Secondary | ICD-10-CM | POA: Insufficient documentation

## 2013-02-12 DIAGNOSIS — Z8639 Personal history of other endocrine, nutritional and metabolic disease: Secondary | ICD-10-CM | POA: Insufficient documentation

## 2013-02-12 DIAGNOSIS — S6721XA Crushing injury of right hand, initial encounter: Secondary | ICD-10-CM

## 2013-02-12 DIAGNOSIS — Y929 Unspecified place or not applicable: Secondary | ICD-10-CM | POA: Insufficient documentation

## 2013-02-12 DIAGNOSIS — S6720XA Crushing injury of unspecified hand, initial encounter: Secondary | ICD-10-CM | POA: Insufficient documentation

## 2013-02-12 DIAGNOSIS — Z8739 Personal history of other diseases of the musculoskeletal system and connective tissue: Secondary | ICD-10-CM | POA: Insufficient documentation

## 2013-02-12 DIAGNOSIS — Z880 Allergy status to penicillin: Secondary | ICD-10-CM | POA: Insufficient documentation

## 2013-02-12 MED ORDER — HYDROCODONE-ACETAMINOPHEN 5-325 MG PO TABS: 2.0000 | ORAL_TABLET | ORAL | Status: DC | PRN

## 2013-02-12 MED ORDER — HYDROCODONE-ACETAMINOPHEN 5-325 MG PO TABS
2.0000 | ORAL_TABLET | Freq: Once | ORAL | Status: AC
  Administered 2013-02-12: 2 via ORAL
  Filled 2013-02-12: qty 2

## 2013-02-12 NOTE — ED Notes (Signed)
Pt reports that he got a weight dropped on his hand.  Reports (R) hand pain.  Slight swelling noted.
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
## 2013-02-12 NOTE — ED Provider Notes (Signed)
Medical screening examination/treatment/procedure(s) were performed by non-physician practitioner and as supervising physician I was immediately available for consultation/collaboration.  EKG Interpretation   None        Ethelda Chick, MD 02/12/13 1549

## 2013-02-18 ENCOUNTER — Encounter: Payer: Self-pay | Admitting: Family Medicine

## 2013-02-18 ENCOUNTER — Ambulatory Visit (INDEPENDENT_AMBULATORY_CARE_PROVIDER_SITE_OTHER): Payer: Self-pay | Admitting: Family Medicine

## 2013-02-18 VITALS — BP 140/80 | Ht 73.0 in | Wt 205.0 lb

## 2013-02-18 DIAGNOSIS — S6990XA Unspecified injury of unspecified wrist, hand and finger(s), initial encounter: Secondary | ICD-10-CM

## 2013-02-18 DIAGNOSIS — M25531 Pain in right wrist: Secondary | ICD-10-CM

## 2013-02-18 DIAGNOSIS — S6991XA Unspecified injury of right wrist, hand and finger(s), initial encounter: Secondary | ICD-10-CM

## 2013-02-18 DIAGNOSIS — M25539 Pain in unspecified wrist: Secondary | ICD-10-CM

## 2013-02-18 MED ORDER — TRAMADOL HCL 50 MG PO TABS: 50.0000 mg | ORAL_TABLET | Freq: Three times a day (TID) | ORAL | Status: DC | PRN

## 2013-02-18 NOTE — Patient Instructions (Signed)
You have sprained your right wrist. These usually take 4-6 weeks to completely recover. I don't think you aggravated your old fracture. Wear wrist brace as often as possible including at bedtime. Wear it when working too. Ice area 15 minutes at a time 3-4 times a day. Aleve 2 tabs twice a day with food OR ibuprofen 600mg  three times a day with food for inflammation, swelling. Elevate above the level of your heart when possible. No lifting more than 15 pounds at work. Follow up with me in 3 weeks for reevaluation.

## 2013-02-21 ENCOUNTER — Encounter: Payer: Self-pay | Admitting: Family Medicine

## 2013-02-21 DIAGNOSIS — S6991XA Unspecified injury of right wrist, hand and finger(s), initial encounter: Secondary | ICD-10-CM | POA: Insufficient documentation

## 2013-02-21 NOTE — Assessment & Plan Note (Signed)
consistent with sprain.  Expect 4-6 weeks to recover.  Wrist brace as often as possible.  Icing, nsaids with tramadol as needed but no refills on this.  No lifting more than 15 pounds at work while recovering from this.  F/u in 3 weeks for reevaluation.
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
## 2013-03-04 ENCOUNTER — Ambulatory Visit (INDEPENDENT_AMBULATORY_CARE_PROVIDER_SITE_OTHER): Payer: Self-pay | Admitting: Family Medicine

## 2013-03-04 ENCOUNTER — Encounter: Payer: Self-pay | Admitting: Family Medicine

## 2013-03-04 VITALS — BP 138/83 | HR 78 | Ht 73.0 in | Wt 205.0 lb

## 2013-03-04 DIAGNOSIS — S63501D Unspecified sprain of right wrist, subsequent encounter: Secondary | ICD-10-CM

## 2013-03-04 DIAGNOSIS — Z5189 Encounter for other specified aftercare: Secondary | ICD-10-CM

## 2013-03-04 DIAGNOSIS — S6991XD Unspecified injury of right wrist, hand and finger(s), subsequent encounter: Secondary | ICD-10-CM

## 2013-03-04 MED ORDER — MELOXICAM 15 MG PO TABS: 15.0000 mg | ORAL_TABLET | Freq: Every day | ORAL | Status: DC

## 2013-03-04 NOTE — Patient Instructions (Signed)
Expect another 2-4 weeks to recover. Wear wrist brace as often as possible including at bedtime. Wear it when working too. Ice area 15 minutes at a time 3-4 times a day. Meloxicam 15 mg daily with food for pain and inflammation. No lifting more than 15 pounds at work. Follow up with me in 1 month for reevaluation.

## 2013-03-06 ENCOUNTER — Encounter: Payer: Self-pay | Admitting: Family Medicine
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
## 2013-03-07 NOTE — Assessment & Plan Note (Signed)
consistent with sprain.  Expect another 2-4 weeks to recover.  New wrist brace provided.  Icing, mobic as needed.  Continue current restrictions.  F/u in 1 month.

## 2013-04-06 ENCOUNTER — Ambulatory Visit: Payer: Self-pay | Admitting: Family Medicine

## 2013-10-13 ENCOUNTER — Emergency Department (HOSPITAL_BASED_OUTPATIENT_CLINIC_OR_DEPARTMENT_OTHER)
Admission: EM | Admit: 2013-10-13 | Discharge: 2013-10-13 | Disposition: A | Discharge status: Home / Self Care | Payer: Self-pay | Attending: Emergency Medicine | Admitting: Emergency Medicine

## 2013-10-13 ENCOUNTER — Emergency Department (HOSPITAL_BASED_OUTPATIENT_CLINIC_OR_DEPARTMENT_OTHER): Payer: Self-pay

## 2013-10-13 ENCOUNTER — Encounter (HOSPITAL_BASED_OUTPATIENT_CLINIC_OR_DEPARTMENT_OTHER): Payer: Self-pay | Admitting: Emergency Medicine

## 2013-10-13 DIAGNOSIS — S99929A Unspecified injury of unspecified foot, initial encounter: Secondary | ICD-10-CM

## 2013-10-13 DIAGNOSIS — Y92410 Unspecified street and highway as the place of occurrence of the external cause: Secondary | ICD-10-CM | POA: Insufficient documentation

## 2013-10-13 DIAGNOSIS — S8990XA Unspecified injury of unspecified lower leg, initial encounter: Secondary | ICD-10-CM | POA: Insufficient documentation

## 2013-10-13 DIAGNOSIS — S82409A Unspecified fracture of shaft of unspecified fibula, initial encounter for closed fracture: Principal | ICD-10-CM

## 2013-10-13 DIAGNOSIS — S82402A Unspecified fracture of shaft of left fibula, initial encounter for closed fracture: Secondary | ICD-10-CM

## 2013-10-13 DIAGNOSIS — Z4801 Encounter for change or removal of surgical wound dressing: Secondary | ICD-10-CM | POA: Insufficient documentation

## 2013-10-13 DIAGNOSIS — Y9389 Activity, other specified: Secondary | ICD-10-CM | POA: Insufficient documentation

## 2013-10-13 DIAGNOSIS — M4120 Other idiopathic scoliosis, site unspecified: Secondary | ICD-10-CM | POA: Insufficient documentation

## 2013-10-13 DIAGNOSIS — S99919A Unspecified injury of unspecified ankle, initial encounter: Secondary | ICD-10-CM

## 2013-10-13 DIAGNOSIS — S82209A Unspecified fracture of shaft of unspecified tibia, initial encounter for closed fracture: Secondary | ICD-10-CM | POA: Insufficient documentation

## 2013-10-13 DIAGNOSIS — F17200 Nicotine dependence, unspecified, uncomplicated: Secondary | ICD-10-CM | POA: Insufficient documentation

## 2013-10-13 DIAGNOSIS — Z4802 Encounter for removal of sutures: Secondary | ICD-10-CM

## 2013-10-13 DIAGNOSIS — Z79899 Other long term (current) drug therapy: Secondary | ICD-10-CM | POA: Insufficient documentation

## 2013-10-13 DIAGNOSIS — Z880 Allergy status to penicillin: Secondary | ICD-10-CM | POA: Insufficient documentation

## 2013-10-13 MED ORDER — OXYCODONE-ACETAMINOPHEN 5-325 MG PO TABS: 1.0000 | ORAL_TABLET | Freq: Four times a day (QID) | ORAL | Status: DC | PRN
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
## 2013-10-13 NOTE — Discharge Instructions (Signed)
Return to the ED with any concerns including increased pain, swelling/numbness/discoloration of foot or toes, or any other alarming symptoms °
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
## 2014-05-16 ENCOUNTER — Ambulatory Visit (INDEPENDENT_AMBULATORY_CARE_PROVIDER_SITE_OTHER): Payer: Self-pay | Admitting: Sports Medicine

## 2014-05-16 ENCOUNTER — Encounter: Payer: Self-pay | Admitting: Sports Medicine

## 2014-05-16 VITALS — BP 159/91 | Ht 73.0 in | Wt 204.0 lb

## 2014-05-16 DIAGNOSIS — M25572 Pain in left ankle and joints of left foot: Secondary | ICD-10-CM

## 2014-05-16 MED ORDER — AMITRIPTYLINE HCL 25 MG PO TABS: 25.0000 mg | ORAL_TABLET | Freq: Every day | ORAL | Status: DC

## 2014-05-16 NOTE — Assessment & Plan Note (Addendum)
Available images reviewed with Dr. Darrick PennaFields. Has old comminuted fracture of L distal fibula which has calloused over. This may have contributed to his continued pain in this area. He clearly needs more support of L ankle.  Plan: -He was fitted with a full ankle body helix today.  -Wedge also installed into shoe to correct inversion.  He strikes in marked supination so we placed wedge lift to outside -amitriptyline 25mg  QHS, if no improvement in 3 weeks increase to 50mg  QHS -f/u in 6 weeks for reevaluation.
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
## 2014-05-16 NOTE — Patient Instructions (Signed)
Take amitriptyline 25mg  at bedtime. If not improving, in 3 weeks increase to 2 tablets (50mg  total) at bedtime. Use the body helix ankle support and the wedge in your shoe Follow up in 6 weeks.

## 2014-05-17 ENCOUNTER — Encounter (HOSPITAL_COMMUNITY): Payer: Self-pay | Admitting: *Deleted

## 2014-05-17 ENCOUNTER — Emergency Department (HOSPITAL_COMMUNITY)
Admission: EM | Admit: 2014-05-17 | Discharge: 2014-05-17 | Discharge status: Against Medical Advice | Payer: Self-pay | Attending: Emergency Medicine | Admitting: Emergency Medicine

## 2014-05-17 DIAGNOSIS — Z87828 Personal history of other (healed) physical injury and trauma: Secondary | ICD-10-CM | POA: Insufficient documentation

## 2014-05-17 DIAGNOSIS — M25572 Pain in left ankle and joints of left foot: Secondary | ICD-10-CM | POA: Insufficient documentation

## 2014-05-17 DIAGNOSIS — Z720 Tobacco use: Secondary | ICD-10-CM | POA: Insufficient documentation

## 2014-05-17 DIAGNOSIS — R531 Weakness: Secondary | ICD-10-CM | POA: Insufficient documentation

## 2014-05-17 DIAGNOSIS — Z880 Allergy status to penicillin: Secondary | ICD-10-CM | POA: Insufficient documentation

## 2014-05-17 DIAGNOSIS — Z79899 Other long term (current) drug therapy: Secondary | ICD-10-CM | POA: Insufficient documentation

## 2014-05-17 DIAGNOSIS — M419 Scoliosis, unspecified: Secondary | ICD-10-CM | POA: Insufficient documentation

## 2014-05-17 LAB — CBG MONITORING, ED: Glucose-Capillary: 108 mg/dL — ABNORMAL HIGH (ref 70–99)

## 2014-05-17 NOTE — ED Notes (Signed)
Bed: ZO10WA19 Expected date:  Expected time:  Means of arrival:  Comments: Ems- 44 yo, hyoglycemic initially, cbg 130 now

## 2014-05-17 NOTE — ED Notes (Signed)
Pt convinced that this Clinical research associatewriter was on [employer].  Pt reoriented to reality.

## 2014-05-17 NOTE — ED Notes (Signed)
Per md pt allowed to eat. Food and drink provided

## 2014-05-17 NOTE — ED Notes (Addendum)
Per ems pt is from home, hx of hypoglycemia. Initially pt was lethargic cbg 87 after pt drank juice. Pt ate peanut butter and jelly sandwhich. Last cbg 105. Pt now alert and oriented x4. Ems reports pt keeps falling asleep. Reports lots of stress. Pt reports he did not eat anything today until the sandwich.   Upon rn assessment. Pt falling asleep. Admits he took pain pills because his left ankle hurts. Pt not making complete sense. Alert to person and place.

## 2014-05-17 NOTE — ED Notes (Signed)
md at bedside

## 2014-05-17 NOTE — ED Notes (Signed)
Pt dressed and walking out of room with family. rn asked if pt still had an IV in, pt would not answer, family asked pt if he had an IV in. Pt still did not answer. Pt started walking out, rn followed to ask if pt had an IV in. Pt would not speak with rn, cheek tech assisting and security. rn clarifying with pt that he was leaving AMA. Cheek explaining to pt that rn was letting him go but that nurse wanted to make sure pt did not have an IV in. Pt then stated "letting me go, I have a problem with that. I am leaving. I have been in jail for 14 years. No one is making me stay anywhere". rn walked away.

## 2014-05-18 ENCOUNTER — Emergency Department (HOSPITAL_BASED_OUTPATIENT_CLINIC_OR_DEPARTMENT_OTHER)
Admission: EM | Admit: 2014-05-18 | Discharge: 2014-05-18 | Disposition: A | Discharge status: Home / Self Care | Payer: Self-pay | Attending: Emergency Medicine | Admitting: Emergency Medicine

## 2014-05-18 ENCOUNTER — Encounter (HOSPITAL_BASED_OUTPATIENT_CLINIC_OR_DEPARTMENT_OTHER): Payer: Self-pay | Admitting: *Deleted

## 2014-05-18 DIAGNOSIS — Z8639 Personal history of other endocrine, nutritional and metabolic disease: Secondary | ICD-10-CM | POA: Insufficient documentation

## 2014-05-18 DIAGNOSIS — N4889 Other specified disorders of penis: Secondary | ICD-10-CM | POA: Insufficient documentation

## 2014-05-18 DIAGNOSIS — Z720 Tobacco use: Secondary | ICD-10-CM | POA: Insufficient documentation

## 2014-05-18 DIAGNOSIS — Z880 Allergy status to penicillin: Secondary | ICD-10-CM | POA: Insufficient documentation

## 2014-05-18 DIAGNOSIS — Z8739 Personal history of other diseases of the musculoskeletal system and connective tissue: Secondary | ICD-10-CM | POA: Insufficient documentation

## 2014-05-18 LAB — URINALYSIS, ROUTINE W REFLEX MICROSCOPIC
Bilirubin Urine: NEGATIVE
Glucose, UA: NEGATIVE mg/dL
Hgb urine dipstick: NEGATIVE
Ketones, ur: 15 mg/dL — AB
Leukocytes, UA: NEGATIVE
NITRITE: NEGATIVE
PH: 6 (ref 5.0–8.0)
Protein, ur: NEGATIVE mg/dL
SPECIFIC GRAVITY, URINE: 1.031 — AB (ref 1.005–1.030)
UROBILINOGEN UA: 0.2 mg/dL (ref 0.0–1.0)

## 2014-05-18 NOTE — ED Notes (Signed)
Pt reports that he had a urinary catheter placed last week at [facility]. Pt says he took the catheter out himself because he did not feel as though he needed the catheter. Pt reports he has urinated since taking out the catheter and denies any bloody discharge or urinary symptoms
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
## 2014-06-11 ENCOUNTER — Encounter (HOSPITAL_BASED_OUTPATIENT_CLINIC_OR_DEPARTMENT_OTHER): Payer: Self-pay | Admitting: *Deleted

## 2014-06-11 ENCOUNTER — Emergency Department (HOSPITAL_BASED_OUTPATIENT_CLINIC_OR_DEPARTMENT_OTHER)
Admission: EM | Admit: 2014-06-11 | Discharge: 2014-06-11 | Disposition: A | Discharge status: Home / Self Care | Payer: Self-pay | Attending: Emergency Medicine | Admitting: Emergency Medicine

## 2014-06-11 DIAGNOSIS — Z8739 Personal history of other diseases of the musculoskeletal system and connective tissue: Secondary | ICD-10-CM | POA: Insufficient documentation

## 2014-06-11 DIAGNOSIS — K029 Dental caries, unspecified: Secondary | ICD-10-CM | POA: Insufficient documentation

## 2014-06-11 DIAGNOSIS — L0201 Cutaneous abscess of face: Secondary | ICD-10-CM | POA: Insufficient documentation

## 2014-06-11 DIAGNOSIS — Z880 Allergy status to penicillin: Secondary | ICD-10-CM | POA: Insufficient documentation

## 2014-06-11 DIAGNOSIS — Z720 Tobacco use: Secondary | ICD-10-CM | POA: Insufficient documentation

## 2014-06-11 DIAGNOSIS — Z8639 Personal history of other endocrine, nutritional and metabolic disease: Secondary | ICD-10-CM | POA: Insufficient documentation

## 2014-06-11 LAB — CBG MONITORING, ED: GLUCOSE-CAPILLARY: 89 mg/dL (ref 70–99)

## 2014-06-11 MED ORDER — CEPHALEXIN 500 MG PO CAPS: 500.0000 mg | ORAL_CAPSULE | Freq: Four times a day (QID) | ORAL | Status: DC

## 2014-06-11 MED ORDER — SULFAMETHOXAZOLE-TRIMETHOPRIM 800-160 MG PO TABS
1.0000 | ORAL_TABLET | Freq: Once | ORAL | Status: AC
  Administered 2014-06-11: 1 via ORAL
  Filled 2014-06-11: qty 1

## 2014-06-11 MED ORDER — SULFAMETHOXAZOLE-TRIMETHOPRIM 800-160 MG PO TABS
1.0000 | ORAL_TABLET | Freq: Once | ORAL | Status: AC
Start: 2014-06-11 — End: 2014-06-11
  Administered 2014-06-11: 1 via ORAL
  Filled 2014-06-11: qty 1

## 2014-06-11 MED ORDER — OXYCODONE-ACETAMINOPHEN 5-325 MG PO TABS: ORAL_TABLET | ORAL | Status: DC

## 2014-06-11 MED ORDER — CEPHALEXIN 250 MG PO CAPS
500.0000 mg | ORAL_CAPSULE | Freq: Once | ORAL | Status: AC
Start: 2014-06-11 — End: 2014-06-11
  Administered 2014-06-11: 500 mg via ORAL
  Filled 2014-06-11: qty 2

## 2014-06-11 MED ORDER — SULFAMETHOXAZOLE-TRIMETHOPRIM 800-160 MG PO TABS: 1.0000 | ORAL_TABLET | Freq: Two times a day (BID) | ORAL | Status: DC

## 2014-06-11 MED ORDER — BUPIVACAINE-EPINEPHRINE (PF) 0.5% -1:200000 IJ SOLN
1.8000 mL | Freq: Once | INTRAMUSCULAR | Status: AC
  Administered 2014-06-11: 1.8 mL
  Filled 2014-06-11: qty 1.8

## 2014-06-11 NOTE — ED Notes (Signed)
Pt reports he had popped a bump on his nose 4 days ago- now teeth hurt and he has a headache- redness and swelling noted to nose
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
## 2014-06-11 NOTE — Discharge Instructions (Signed)
If you see signs of worsening infection (warmth, redness, tenderness, pus, sharp increase in pain, fever, red streaking) immediately return to the emergency department.  Take percocet for breakthrough pain, do not drink alcohol, drive, care for children or do other critical tasks while taking percocet.  Return to the emergency room for fever, change in vision, redness to the face that rapidly spreads towards the eye, nausea or vomiting, difficulty swallowing or shortness of breath.   Apply warm compresses to jaw throughout the day.   Take your antibiotics as directed and to the end of the course. DO NOT drink alcohol when taking metronidazole, it will make you very sick!   Followup with a dentist is very important for ongoing evaluation and management of recurrent dental pain. Return to emergency department for emergent changing or worsening symptoms."  Low-cost dental clinic: [facility]**David  Civils  at [phone]**  **Nuala AlphaJanna Civils at [phone] [address]**    You may also call [phone]  Dental Assistance If the dentist on-call cannot see you, please use the resources below:   Patients with Medicaid: [facility] [phone], [phone] [address], [phone]  If unable to pay, or uninsured, contact [facility] [phone]) or [facility] [phone] in [place], [phone] in [place]) to become qualified for the adult dental clinic  Other Low-Cost Community Dental Services: [facility]- [address], [place], [place], [address]    [phone], Ext. [phone]    2nd and 4th Thursday of the month at 6:30am    10 clients each day by appointment, can sometimes see walk-in     patients if someone does not show for an appointment [facility]- [address], [place], [place], [id number]    [phone] [facility]- [address], [place], [place], [address]    [phone] Facial  Infection You have an infection of your face. This requires special attention to help prevent serious problems. Infections in facial wounds can cause poor healing and scars. They can also spread to deeper tissues, especially around the eye. Wound and dental infections can lead to sinusitis, infection of the eye socket, and even meningitis. Permanent damage to the skin, eye, and nervous system may result if facial infections are not treated properly. With severe infections, hospital care for IV antibiotic injections may be needed if they don't respond to oral antibiotics. Antibiotics must be taken for the full course to insure the infection is eliminated. If the infection came from a bad tooth, it may have to be extracted when the infection is under control. Warm compresses may be applied to reduce skin irritation and remove drainage. You might need a tetanus shot now if:  You cannot remember when your last tetanus shot was.  You have never had a tetanus shot.  The object that caused your wound was dirty. If you need a tetanus shot, and you decide not to get one, there is a rare chance of getting tetanus. Sickness from tetanus can be serious. If you got a tetanus shot, your arm may swell, get red and warm to the touch at the shot site. This is common and not a problem. SEEK IMMEDIATE MEDICAL CARE IF:   You have increased swelling, redness, or trouble breathing.  You have a severe headache, dizziness, nausea, or vomiting.  You develop problems with your eyesight.  You have a fever. Document Released: 04/10/2004 Document Revised: 05/26/2011 Document Reviewed: 03/03/2005 ExitCare Patient Information  2015 [employer]. This information is not intended to replace advice given to you by your health care provider. Make sure you discuss any questions you have with your health care provider.   [facility]- [phone] [facility]- [phone] [facility] [phone]

## 2014-06-11 NOTE — ED Notes (Signed)
Tip of nose noted to have open wound area, some drainage noted, appears read and swollen, onset 4 days, pt states very painful and sensitive

## 2014-06-11 NOTE — ED Notes (Signed)
Pt appears to be resting more quietly at this time, appears more comfortable, informs this RN that pain is still present more has relief and is more tolerable after ED provider care

## 2014-06-28 ENCOUNTER — Ambulatory Visit: Payer: Self-pay | Admitting: Sports Medicine

## 2015-01-18 ENCOUNTER — Encounter (HOSPITAL_COMMUNITY): Payer: Self-pay | Admitting: Emergency Medicine

## 2015-01-18 ENCOUNTER — Emergency Department (HOSPITAL_COMMUNITY)
Admission: EM | Admit: 2015-01-18 | Discharge: 2015-01-18 | Discharge status: Against Medical Advice | Payer: 59 | Attending: Emergency Medicine | Admitting: Emergency Medicine

## 2015-01-18 DIAGNOSIS — Z720 Tobacco use: Secondary | ICD-10-CM | POA: Insufficient documentation

## 2015-01-18 DIAGNOSIS — R2241 Localized swelling, mass and lump, right lower limb: Secondary | ICD-10-CM | POA: Diagnosis not present
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
## 2015-01-18 NOTE — ED Notes (Signed)
Pt approached nurse first desk asking how much longer his wait would be and pt informed the longest wait is 4 hours and 30 minutes. Pt became very angry and started to raise his voice stating he would not be waiting that long and stated he was going to another ER. Pt observed walking out of ED with steady gait, NAD.

## 2015-01-19 ENCOUNTER — Emergency Department (HOSPITAL_COMMUNITY)
Admission: EM | Admit: 2015-01-19 | Discharge: 2015-01-19 | Disposition: A | Discharge status: Home / Self Care | Payer: 59 | Attending: Emergency Medicine | Admitting: Emergency Medicine

## 2015-01-19 ENCOUNTER — Emergency Department (HOSPITAL_COMMUNITY): Payer: 59

## 2015-01-19 ENCOUNTER — Encounter (HOSPITAL_COMMUNITY): Payer: Self-pay | Admitting: Emergency Medicine

## 2015-01-19 ENCOUNTER — Emergency Department (EMERGENCY_DEPARTMENT_HOSPITAL): Payer: 59

## 2015-01-19 DIAGNOSIS — Z880 Allergy status to penicillin: Secondary | ICD-10-CM | POA: Insufficient documentation

## 2015-01-19 DIAGNOSIS — Z720 Tobacco use: Secondary | ICD-10-CM | POA: Diagnosis not present

## 2015-01-19 DIAGNOSIS — M419 Scoliosis, unspecified: Secondary | ICD-10-CM | POA: Diagnosis not present

## 2015-01-19 DIAGNOSIS — Z8639 Personal history of other endocrine, nutritional and metabolic disease: Secondary | ICD-10-CM | POA: Insufficient documentation

## 2015-01-19 DIAGNOSIS — M79604 Pain in right leg: Secondary | ICD-10-CM | POA: Diagnosis present

## 2015-01-19 DIAGNOSIS — I878 Other specified disorders of veins: Secondary | ICD-10-CM

## 2015-01-19 DIAGNOSIS — I872 Venous insufficiency (chronic) (peripheral): Secondary | ICD-10-CM | POA: Insufficient documentation

## 2015-01-19 DIAGNOSIS — M7989 Other specified soft tissue disorders: Secondary | ICD-10-CM

## 2015-01-19 LAB — CBC WITH DIFFERENTIAL/PLATELET
BASOS PCT: 0 %
Basophils Absolute: 0 10*3/uL (ref 0.0–0.1)
Eosinophils Absolute: 0.3 10*3/uL (ref 0.0–0.7)
Eosinophils Relative: 5 %
HCT: 39.8 % (ref 39.0–52.0)
Hemoglobin: 13.7 g/dL (ref 13.0–17.0)
LYMPHS ABS: 1.4 10*3/uL (ref 0.7–4.0)
Lymphocytes Relative: 28 %
MCH: 31.2 pg (ref 26.0–34.0)
MCHC: 34.4 g/dL (ref 30.0–36.0)
MCV: 90.7 fL (ref 78.0–100.0)
MONOS PCT: 11 %
Monocytes Absolute: 0.6 10*3/uL (ref 0.1–1.0)
Neutro Abs: 2.9 10*3/uL (ref 1.7–7.7)
Neutrophils Relative %: 56 %
Platelets: 217 10*3/uL (ref 150–400)
RBC: 4.39 MIL/uL (ref 4.22–5.81)
RDW: 12.9 % (ref 11.5–15.5)
WBC: 5.2 10*3/uL (ref 4.0–10.5)

## 2015-01-19 LAB — COMPREHENSIVE METABOLIC PANEL
ALT: 19 U/L (ref 17–63)
AST: 23 U/L (ref 15–41)
Albumin: 4 g/dL (ref 3.5–5.0)
Alkaline Phosphatase: 65 U/L (ref 38–126)
Anion gap: 7 (ref 5–15)
BUN: 10 mg/dL (ref 6–20)
CO2: 27 mmol/L (ref 22–32)
Calcium: 9.8 mg/dL (ref 8.9–10.3)
Chloride: 100 mmol/L — ABNORMAL LOW (ref 101–111)
Creatinine, Ser: 1.04 mg/dL (ref 0.61–1.24)
GFR calc non Af Amer: >60 mL/min (ref >60)
GLUCOSE: 91 mg/dL (ref 65–99)
POTASSIUM: 4.3 mmol/L (ref 3.5–5.1)
SODIUM: 134 mmol/L — AB (ref 135–145)
TOTAL PROTEIN: 7.2 g/dL (ref 6.5–8.1)
Total Bilirubin: 0.6 mg/dL (ref 0.3–1.2)

## 2015-01-19 NOTE — ED Notes (Signed)
Per pt, states he noticed his right leg was swollen-having pain-states know on right upper thigh-thinks he has blood clot-no SOB

## 2015-01-19 NOTE — ED Notes (Signed)
Pt given d/c instructions, verbalized understanding. 

## 2015-01-19 NOTE — ED Notes (Signed)
Bed: WA02 Expected date:  Expected time:  Means of arrival:  Comments: 
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
## 2015-01-19 NOTE — Discharge Instructions (Signed)
Keep right leg elevated.   Use compression stockings to right leg.  Try and stay off your leg.   See your doctor.   Return to ER if you have worse swelling, shortness of breath.

## 2015-01-19 NOTE — ED Notes (Signed)
Pt to XR and US
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
## 2015-04-17 ENCOUNTER — Other Ambulatory Visit: Payer: Self-pay | Admitting: Family Medicine

## 2015-04-17 ENCOUNTER — Ambulatory Visit
Admission: RE | Admit: 2015-04-17 | Discharge: 2015-04-17 | Disposition: A | Discharge status: Home / Self Care | Payer: Worker's Compensation | Source: Ambulatory Visit | Attending: Family Medicine | Admitting: Family Medicine

## 2015-04-17 DIAGNOSIS — S6992XA Unspecified injury of left wrist, hand and finger(s), initial encounter: Secondary | ICD-10-CM

## 2015-04-21 ENCOUNTER — Encounter (HOSPITAL_COMMUNITY): Payer: Self-pay | Admitting: Emergency Medicine

## 2015-04-21 ENCOUNTER — Observation Stay (HOSPITAL_COMMUNITY)
Admission: EM | Admit: 2015-04-21 | Discharge: 2015-04-24 | Disposition: A | Discharge status: Home / Self Care | Payer: 59 | Attending: Internal Medicine | Admitting: Internal Medicine

## 2015-04-21 DIAGNOSIS — R109 Unspecified abdominal pain: Secondary | ICD-10-CM | POA: Insufficient documentation

## 2015-04-21 DIAGNOSIS — R001 Bradycardia, unspecified: Secondary | ICD-10-CM | POA: Diagnosis not present

## 2015-04-21 DIAGNOSIS — F1193 Opioid use, unspecified with withdrawal: Secondary | ICD-10-CM | POA: Diagnosis present

## 2015-04-21 DIAGNOSIS — R0602 Shortness of breath: Secondary | ICD-10-CM

## 2015-04-21 DIAGNOSIS — Z23 Encounter for immunization: Secondary | ICD-10-CM | POA: Insufficient documentation

## 2015-04-21 DIAGNOSIS — R112 Nausea with vomiting, unspecified: Secondary | ICD-10-CM | POA: Diagnosis not present

## 2015-04-21 DIAGNOSIS — F1410 Cocaine abuse, uncomplicated: Secondary | ICD-10-CM | POA: Diagnosis not present

## 2015-04-21 DIAGNOSIS — F1123 Opioid dependence with withdrawal: Secondary | ICD-10-CM | POA: Diagnosis not present

## 2015-04-21 DIAGNOSIS — F1910 Other psychoactive substance abuse, uncomplicated: Secondary | ICD-10-CM | POA: Diagnosis present

## 2015-04-21 LAB — ACETAMINOPHEN LEVEL

## 2015-04-21 LAB — COMPREHENSIVE METABOLIC PANEL
ALT: 30 U/L (ref 17–63)
AST: 28 U/L (ref 15–41)
Albumin: 4.2 g/dL (ref 3.5–5.0)
Alkaline Phosphatase: 63 U/L (ref 38–126)
Anion gap: 10 (ref 5–15)
BUN: 10 mg/dL (ref 6–20)
CALCIUM: 9.7 mg/dL (ref 8.9–10.3)
CO2: 24 mmol/L (ref 22–32)
CREATININE: 1.09 mg/dL (ref 0.61–1.24)
Chloride: 103 mmol/L (ref 101–111)
GFR calc non Af Amer: >60 mL/min (ref >60)
GLUCOSE: 136 mg/dL — AB (ref 65–99)
Potassium: 3.8 mmol/L (ref 3.5–5.1)
SODIUM: 137 mmol/L (ref 135–145)
Total Bilirubin: 0.4 mg/dL (ref 0.3–1.2)
Total Protein: 7.6 g/dL (ref 6.5–8.1)

## 2015-04-21 LAB — I-STAT CHEM 8, ED
BUN: 9 mg/dL (ref 6–20)
Calcium, Ion: 1.23 mmol/L (ref 1.12–1.23)
Chloride: 103 mmol/L (ref 101–111)
Creatinine, Ser: 1 mg/dL (ref 0.61–1.24)
GLUCOSE: 125 mg/dL — AB (ref 65–99)
HEMATOCRIT: 44 % (ref 39.0–52.0)
HEMOGLOBIN: 15 g/dL (ref 13.0–17.0)
POTASSIUM: 3.7 mmol/L (ref 3.5–5.1)
Sodium: 141 mmol/L (ref 135–145)
TCO2: 24 mmol/L (ref 0–100)

## 2015-04-21 LAB — RAPID URINE DRUG SCREEN, HOSP PERFORMED
Amphetamines: NOT DETECTED
Barbiturates: NOT DETECTED
Benzodiazepines: NOT DETECTED
Cocaine: POSITIVE — AB
Opiates: NOT DETECTED
Tetrahydrocannabinol: NOT DETECTED

## 2015-04-21 LAB — ETHANOL: Alcohol, Ethyl (B): <5 mg/dL (ref <5)

## 2015-04-21 LAB — CBC
HCT: 42.5 % (ref 39.0–52.0)
Hemoglobin: 14.3 g/dL (ref 13.0–17.0)
MCH: 30.5 pg (ref 26.0–34.0)
MCHC: 33.6 g/dL (ref 30.0–36.0)
MCV: 90.6 fL (ref 78.0–100.0)
PLATELETS: 216 10*3/uL (ref 150–400)
RBC: 4.69 MIL/uL (ref 4.22–5.81)
RDW: 13.5 % (ref 11.5–15.5)
WBC: 9.5 10*3/uL (ref 4.0–10.5)

## 2015-04-21 LAB — TROPONIN I

## 2015-04-21 LAB — I-STAT TROPONIN, ED: TROPONIN I, POC: 0 ng/mL (ref 0.00–0.08)

## 2015-04-21 MED ORDER — IBUPROFEN 200 MG PO TABS
600.0000 mg | ORAL_TABLET | Freq: Four times a day (QID) | ORAL | Status: DC | PRN
  Administered 2015-04-21 – 2015-04-22 (×2): 600 mg via ORAL
  Filled 2015-04-21 (×2): qty 3

## 2015-04-21 MED ORDER — SODIUM CHLORIDE 0.9 % IV SOLN
INTRAVENOUS | Status: DC
  Administered 2015-04-21 – 2015-04-24 (×6): via INTRAVENOUS

## 2015-04-21 MED ORDER — SODIUM CHLORIDE 0.9 % IV BOLUS (SEPSIS)
1000.0000 mL | Freq: Once | INTRAVENOUS | Status: AC
  Administered 2015-04-21: 1000 mL via INTRAVENOUS

## 2015-04-21 MED ORDER — ONDANSETRON HCL 4 MG/2ML IJ SOLN
4.0000 mg | Freq: Four times a day (QID) | INTRAMUSCULAR | Status: DC | PRN
  Administered 2015-04-22 (×2): 4 mg via INTRAVENOUS
  Filled 2015-04-21 (×2): qty 2

## 2015-04-21 MED ORDER — ACETAMINOPHEN 325 MG PO TABS
650.0000 mg | ORAL_TABLET | Freq: Four times a day (QID) | ORAL | Status: DC | PRN
  Administered 2015-04-22: 650 mg via ORAL
  Filled 2015-04-21: qty 2

## 2015-04-21 MED ORDER — PROMETHAZINE HCL 25 MG PO TABS
25.0000 mg | ORAL_TABLET | Freq: Four times a day (QID) | ORAL | Status: DC | PRN
  Administered 2015-04-21: 25 mg via ORAL
  Filled 2015-04-21: qty 1

## 2015-04-21 MED ORDER — DIPHENHYDRAMINE HCL 25 MG PO CAPS: 25.0000 mg | ORAL_CAPSULE | Freq: Every evening | ORAL | Status: DC | PRN

## 2015-04-21 MED ORDER — TRAZODONE HCL 50 MG PO TABS
50.0000 mg | ORAL_TABLET | Freq: Every day | ORAL | Status: DC
  Administered 2015-04-21 – 2015-04-22 (×2): 50 mg via ORAL
  Filled 2015-04-21 (×2): qty 1

## 2015-04-21 MED ORDER — SODIUM CHLORIDE 0.9% FLUSH
3.0000 mL | Freq: Two times a day (BID) | INTRAVENOUS | Status: DC
  Administered 2015-04-23: 3 mL via INTRAVENOUS

## 2015-04-21 MED ORDER — ENOXAPARIN SODIUM 40 MG/0.4ML ~~LOC~~ SOLN
40.0000 mg | Freq: Every day | SUBCUTANEOUS | Status: DC
  Administered 2015-04-23: 40 mg via SUBCUTANEOUS
  Filled 2015-04-21 (×3): qty 0.4

## 2015-04-21 MED ORDER — ONDANSETRON HCL 4 MG/2ML IJ SOLN
4.0000 mg | Freq: Once | INTRAMUSCULAR | Status: AC
Start: 2015-04-21 — End: 2015-04-21
  Administered 2015-04-21: 4 mg via INTRAVENOUS
  Filled 2015-04-21: qty 2

## 2015-04-21 MED ORDER — HYDROXYZINE HCL 25 MG PO TABS: 25.0000 mg | ORAL_TABLET | Freq: Three times a day (TID) | ORAL | Status: DC | PRN

## 2015-04-21 MED ORDER — PROMETHAZINE HCL 25 MG/ML IJ SOLN
25.0000 mg | Freq: Four times a day (QID) | INTRAMUSCULAR | Status: DC | PRN
  Administered 2015-04-22 – 2015-04-23 (×4): 25 mg via INTRAVENOUS
  Filled 2015-04-21 (×4): qty 1

## 2015-04-21 MED ORDER — PROMETHAZINE HCL 25 MG RE SUPP: 25.0000 mg | Freq: Four times a day (QID) | RECTAL | Status: DC | PRN

## 2015-04-21 MED ORDER — DICYCLOMINE HCL 10 MG/ML IM SOLN
20.0000 mg | Freq: Once | INTRAMUSCULAR | Status: AC
  Administered 2015-04-21: 20 mg via INTRAMUSCULAR
  Filled 2015-04-21: qty 2

## 2015-04-21 MED ORDER — ONDANSETRON HCL 4 MG/2ML IJ SOLN
4.0000 mg | Freq: Three times a day (TID) | INTRAMUSCULAR | Status: DC | PRN
  Administered 2015-04-21: 4 mg via INTRAVENOUS
  Filled 2015-04-21: qty 2

## 2015-04-21 MED ORDER — PROMETHAZINE HCL 25 MG/ML IJ SOLN
25.0000 mg | Freq: Once | INTRAMUSCULAR | Status: AC
  Administered 2015-04-21: 25 mg via INTRAVENOUS
  Filled 2015-04-21: qty 1

## 2015-04-21 MED ORDER — LOPERAMIDE HCL 2 MG PO CAPS
4.0000 mg | ORAL_CAPSULE | Freq: Once | ORAL | Status: DC
  Filled 2015-04-21: qty 2

## 2015-04-21 MED ORDER — DICYCLOMINE HCL 20 MG PO TABS
20.0000 mg | ORAL_TABLET | Freq: Three times a day (TID) | ORAL | Status: DC
  Filled 2015-04-21 (×5): qty 1

## 2015-04-21 MED ORDER — HYDROXYZINE HCL 50 MG/ML IM SOLN
50.0000 mg | Freq: Four times a day (QID) | INTRAMUSCULAR | Status: DC | PRN
  Administered 2015-04-21: 50 mg via INTRAMUSCULAR
  Filled 2015-04-21 (×2): qty 1

## 2015-04-21 NOTE — ED Notes (Signed)
[facility] aware of pt complaint/status.

## 2015-04-21 NOTE — ED Notes (Signed)
Pt and family made aware that due to the nature of the pt chief complaint, no more pain medicine will be ordered. After stating that, the patient and family asked for nausea medicine. Pt has been given phenergan and zofran.
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
## 2015-04-21 NOTE — ED Notes (Signed)
Dr. Rubin Payor made aware of patient's apical heart rate of 44.
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
## 2015-04-21 NOTE — ED Notes (Signed)
Pt sitting up on the side of the bed kicking and shaking. Family at bedside states that he is in intense pain. MD made aware.

## 2015-04-21 NOTE — ED Notes (Signed)
Pt reports last opiate use yesterday; request detox. Pt complaint of abdominal cramping, joint pain, headache, and emesis/diarrhea.

## 2015-04-21 NOTE — ED Notes (Signed)
MD at bedside. 

## 2015-04-22 ENCOUNTER — Observation Stay (HOSPITAL_COMMUNITY): Payer: 59

## 2015-04-22 DIAGNOSIS — F1910 Other psychoactive substance abuse, uncomplicated: Secondary | ICD-10-CM | POA: Diagnosis not present

## 2015-04-22 DIAGNOSIS — F1123 Opioid dependence with withdrawal: Secondary | ICD-10-CM | POA: Diagnosis not present

## 2015-04-22 DIAGNOSIS — F1410 Cocaine abuse, uncomplicated: Secondary | ICD-10-CM | POA: Diagnosis not present

## 2015-04-22 DIAGNOSIS — G43A1 Cyclical vomiting, intractable: Secondary | ICD-10-CM | POA: Diagnosis not present

## 2015-04-22 DIAGNOSIS — R1110 Vomiting, unspecified: Secondary | ICD-10-CM

## 2015-04-22 DIAGNOSIS — R001 Bradycardia, unspecified: Secondary | ICD-10-CM | POA: Diagnosis not present

## 2015-04-22 DIAGNOSIS — R1084 Generalized abdominal pain: Secondary | ICD-10-CM | POA: Diagnosis not present

## 2015-04-22 DIAGNOSIS — R109 Unspecified abdominal pain: Secondary | ICD-10-CM | POA: Diagnosis not present

## 2015-04-22 LAB — MAGNESIUM: MAGNESIUM: 1.7 mg/dL (ref 1.7–2.4)

## 2015-04-22 LAB — BASIC METABOLIC PANEL
Anion gap: 7 (ref 5–15)
BUN: 9 mg/dL (ref 6–20)
CHLORIDE: 106 mmol/L (ref 101–111)
CO2: 26 mmol/L (ref 22–32)
CREATININE: 1.08 mg/dL (ref 0.61–1.24)
Calcium: 9.2 mg/dL (ref 8.9–10.3)
GFR calc Af Amer: >60 mL/min (ref >60)
GFR calc non Af Amer: >60 mL/min (ref >60)
GLUCOSE: 120 mg/dL — AB (ref 65–99)
Potassium: 4.1 mmol/L (ref 3.5–5.1)
SODIUM: 139 mmol/L (ref 135–145)

## 2015-04-22 LAB — CBC
HEMATOCRIT: 38.8 % — AB (ref 39.0–52.0)
Hemoglobin: 13.2 g/dL (ref 13.0–17.0)
MCH: 30.8 pg (ref 26.0–34.0)
MCHC: 34 g/dL (ref 30.0–36.0)
MCV: 90.4 fL (ref 78.0–100.0)
PLATELETS: 217 10*3/uL (ref 150–400)
RBC: 4.29 MIL/uL (ref 4.22–5.81)
RDW: 13.5 % (ref 11.5–15.5)
WBC: 12.4 10*3/uL — ABNORMAL HIGH (ref 4.0–10.5)

## 2015-04-22 LAB — LACTIC ACID, PLASMA
Lactic Acid, Venous: 1.2 mmol/L (ref 0.5–2.0)
Lactic Acid, Venous: 1.2 mmol/L (ref 0.5–2.0)

## 2015-04-22 LAB — LIPASE, BLOOD: Lipase: 42 U/L (ref 11–51)

## 2015-04-22 LAB — TROPONIN I
Troponin I: 0.03 ng/mL (ref <0.031)
Troponin I: <0.03 ng/mL (ref <0.031)

## 2015-04-22 LAB — TSH: TSH: 0.136 u[IU]/mL — AB (ref 0.350–4.500)

## 2015-04-22 MED ORDER — PANTOPRAZOLE SODIUM 40 MG IV SOLR
40.0000 mg | Freq: Two times a day (BID) | INTRAVENOUS | Status: DC
  Administered 2015-04-22 – 2015-04-24 (×5): 40 mg via INTRAVENOUS
  Filled 2015-04-22 (×5): qty 40

## 2015-04-22 MED ORDER — SODIUM CHLORIDE 0.9 % IV SOLN
8.0000 mg | Freq: Three times a day (TID) | INTRAVENOUS | Status: DC | PRN
  Filled 2015-04-22 (×2): qty 4

## 2015-04-22 MED ORDER — INFLUENZA VAC SPLIT QUAD 0.5 ML IM SUSY
0.5000 mL | PREFILLED_SYRINGE | INTRAMUSCULAR | Status: AC
  Administered 2015-04-23: 0.5 mL via INTRAMUSCULAR
  Filled 2015-04-22 (×2): qty 0.5

## 2015-04-22 MED ORDER — KETOROLAC TROMETHAMINE 30 MG/ML IJ SOLN
30.0000 mg | Freq: Three times a day (TID) | INTRAMUSCULAR | Status: DC
  Administered 2015-04-22 – 2015-04-23 (×3): 30 mg via INTRAVENOUS
  Filled 2015-04-22 (×3): qty 1

## 2015-04-22 MED ORDER — LORAZEPAM 2 MG/ML IJ SOLN
0.5000 mg | Freq: Four times a day (QID) | INTRAMUSCULAR | Status: DC | PRN
  Administered 2015-04-22 – 2015-04-23 (×3): 0.5 mg via INTRAVENOUS
  Filled 2015-04-22 (×3): qty 1

## 2015-04-22 NOTE — Progress Notes (Signed)
[facility] Progress Note  Patient: Roger Hurley ZOX:[id number]   PCP: No PCP Per Patient DOB: 03-17-1971   DOA: 04/21/2015   DOS: 04/22/2015   Date of Service: the patient was seen and examined on 04/22/2015  Subjective: Continues to complain of the abdominal pain, persistent nausea with 3 episodes of vomiting. No diarrhea Nutrition: Tolerating liquid diet Activity: Bedridden at present Last BM: Prior to arrival  Assessment and Plan: 1. Bradycardia Patient presents with complaints of nausea vomiting. He does not have any focal deficit. Abdominal tenderness is present. Other than that he is also found to be having bradycardic episodes without any sinus pause or heart block. Possibly vagal response given his nausea and vomiting. We'll monitor him on telemetry overnight.  2. Intractable nausea and vomiting. Substance abuse Suspected opioid withdrawal. UDS is positive for cocaine. Avoiding clonidine for detoxification in the setting of bradycardia. Discontinuing Bentyl. Use IV Toradol scheduled, Zofran increased dose, lorazepam when necessary. Do not use IV narcotics. X-ray abdomen is negative for any obstruction or acute intra-abdominal pathology, lactic acid 3 is negative, lipase normal, BMP does not show any evidence of acidosis as well. At present this does not appear to be any organic etiology which is causing abdominal pain. Monitor clinically.   DVT Prophylaxis: subcutaneous Heparin Nutrition: Clear liquid diet Advance goals of care discussion: Full code  Brief Summary of Hospitalization:  HPI: As per the H and P dictated on admission, "Roger Hurley is a 45 y.o. male with no significant past medical history who presents with pain all over after stopping oxycodone.  The patient was in his usual state of health until the last week when he stopped taking daily oxycodone, which he obtained illicitly. Since then, he has developed pain "all over" (in the head, joints, abdomen),  severe in intensity and constant, as well as diarrhea and vomiting. He presents requesting help with detox.  In the ED, the patient was noted to be bradycardic to the 30s and 40s but without hypotension or confusion. He was writhing in pain, begging for relief. K 3.8, Cr 1.1, transaminases/EtOH/acetaminophen/TNI normal. UDS only positive for cocaine.   He was accompanied by his aunt with whom he lives, his mother and a spiritual friend. 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 He denied symptoms of bradycardia, syncope, dizziness, weakness, dyspnea." Daily update, Procedures: clear liquid diet on 04/22/2015 Consultants: none Antibiotics: Anti-infectives    None       Family Communication: no family was present at bedside, at the time of interview.   Disposition:  Expected discharge date: 04/23/2015 Barriers to safe discharge: Improvement in nausea, improvement in bradycardia  No intake or output data in the 24 hours ending 04/22/15 1634 Filed Weights   04/21/15 2317  Weight: 88.4 kg (194 lb 14.2 oz)    Objective: Physical Exam: Filed Vitals:   04/21/15 2207 04/21/15 2317 04/22/15 0551 04/22/15 1500  BP: 153/67  143/58 138/64  Pulse: 45  62 51  Temp:   98.9 F (37.2 C)   TempSrc:   Oral   Resp: Height:   (1.854 m)    Weight:  88.4 kg (194 lb 14.2 oz)    SpO2: 100%  100% 100%     General: Appear in moderate distress, no Rash; Oral Mucosa moist. Cardiovascular: S1 and S2 Present, no Murmur, no JVD Respiratory: Bilateral Air entry present and Clear to Auscultation, no Crackles, no wheezes Abdomen: Bowel Sound present, Soft and diffuse tenderness Extremities:  no Pedal edema, no calf tenderness Neurology: Grossly no focal neuro deficit.  Data Reviewed: CBC:  Recent Labs Lab 04/21/15 1640 04/21/15 1740 04/22/15 0543  WBC 9.5  --  12.4*  HGB 14.3 15.0 13.2  HCT 42.5 44.0 38.8*  MCV 90.6  --  90.4  PLT 216  --  217   Basic Metabolic Panel:  Recent  Labs Lab 04/21/15 1640 04/21/15 1740 04/21/15 2330 04/22/15 0543  NA 137 141  --  139  K 3.8 3.7  --  4.1  CL 103 103  --  106  CO2 24  --   --  26  GLUCOSE 136* 125*  --  120*  BUN 10 9  --  9  CREATININE 1.09 1.00  --  1.08  CALCIUM 9.7  --   --  9.2  MG  --   --  1.7  --    Liver Function Tests:  Recent Labs Lab 04/21/15 1640  AST 28  ALT 30  ALKPHOS 63  BILITOT 0.4  PROT 7.6  ALBUMIN 4.2    Recent Labs Lab 04/22/15 0947  LIPASE 42   No results for input(s): AMMONIA in the last 168 hours.  Cardiac Enzymes:  Recent Labs Lab 04/21/15 1645 04/21/15 2330 04/22/15 0543  TROPONINI <0.03 <0.03 0.03    BNP (last 3 results) No results for input(s): BNP in the last 8760 hours.  CBG: No results for input(s): GLUCAP in the last 168 hours.  No results found for this or any previous visit (from the past 240 hour(s)).   Studies: Dg Abd Acute W/chest  04/22/2015  CLINICAL DATA:  Nausea vomiting and abdominal pain.  Short of breath EXAM: DG ABDOMEN ACUTE W/ 1V CHEST COMPARISON:  Chest 07/01/2012 FINDINGS: The lungs are clear. No infiltrate effusion or edema. Moderate dextroscoliosis of the thoracic spine. Normal bowel gas pattern. No bowel obstruction or ileus. No free air. No renal calculi. No acute skeletal abnormality. Moderate degenerative change in the right hip. IMPRESSION: No acute abnormality in the chest or abdomen. Electronically Signed   By: Marlan Palau M.D.   On: 04/22/2015 10:52     Scheduled Meds: . enoxaparin (LOVENOX) injection  40 mg Subcutaneous QHS  . [START ON 04/23/2015] Influenza vac split quadrivalent PF  0.5 mL Intramuscular Tomorrow-1000  . ketorolac  30 mg Intravenous 3 times per day  . pantoprazole (PROTONIX) IV  40 mg Intravenous Q12H  . sodium chloride flush  3 mL Intravenous Q12H  . traZODone  50 mg Oral QHS   Continuous Infusions: . sodium chloride 125 mL/hr at 04/22/15 1207   PRN Meds: acetaminophen, diphenhydrAMINE, hydrOXYzine,  ibuprofen, LORazepam, ondansetron (ZOFRAN) IV, promethazine **OR** promethazine **OR** promethazine  Time spent: 30 minutes  Author: Lynden Oxford, MD [facility] Pager: [phone] 04/22/2015 4:34 PM  If 7PM-7AM, please contact night-coverage at [email or web address], password [facility]

## 2015-04-23 DIAGNOSIS — F1410 Cocaine abuse, uncomplicated: Secondary | ICD-10-CM | POA: Diagnosis not present

## 2015-04-23 DIAGNOSIS — F1910 Other psychoactive substance abuse, uncomplicated: Secondary | ICD-10-CM | POA: Diagnosis not present

## 2015-04-23 DIAGNOSIS — R112 Nausea with vomiting, unspecified: Secondary | ICD-10-CM | POA: Insufficient documentation

## 2015-04-23 DIAGNOSIS — R1084 Generalized abdominal pain: Secondary | ICD-10-CM

## 2015-04-23 DIAGNOSIS — R109 Unspecified abdominal pain: Secondary | ICD-10-CM | POA: Diagnosis not present

## 2015-04-23 DIAGNOSIS — R001 Bradycardia, unspecified: Secondary | ICD-10-CM | POA: Diagnosis not present

## 2015-04-23 DIAGNOSIS — R1110 Vomiting, unspecified: Secondary | ICD-10-CM | POA: Diagnosis not present

## 2015-04-23 DIAGNOSIS — F1123 Opioid dependence with withdrawal: Secondary | ICD-10-CM | POA: Diagnosis not present

## 2015-04-23 LAB — MAGNESIUM: MAGNESIUM: 1.7 mg/dL (ref 1.7–2.4)

## 2015-04-23 LAB — BASIC METABOLIC PANEL
Anion gap: 7 (ref 5–15)
BUN: 10 mg/dL (ref 6–20)
CHLORIDE: 105 mmol/L (ref 101–111)
CO2: 28 mmol/L (ref 22–32)
CREATININE: 1.23 mg/dL (ref 0.61–1.24)
Calcium: 9.2 mg/dL (ref 8.9–10.3)
GFR calc Af Amer: >60 mL/min (ref >60)
GFR calc non Af Amer: >60 mL/min (ref >60)
Glucose, Bld: 113 mg/dL — ABNORMAL HIGH (ref 65–99)
POTASSIUM: 3.6 mmol/L (ref 3.5–5.1)
SODIUM: 140 mmol/L (ref 135–145)

## 2015-04-23 LAB — CBC
HEMATOCRIT: 39.1 % (ref 39.0–52.0)
HEMOGLOBIN: 13 g/dL (ref 13.0–17.0)
MCH: 30.7 pg (ref 26.0–34.0)
MCHC: 33.2 g/dL (ref 30.0–36.0)
MCV: 92.4 fL (ref 78.0–100.0)
Platelets: 221 10*3/uL (ref 150–400)
RBC: 4.23 MIL/uL (ref 4.22–5.81)
RDW: 13.5 % (ref 11.5–15.5)
WBC: 10.6 10*3/uL — ABNORMAL HIGH (ref 4.0–10.5)

## 2015-04-23 MED ORDER — ONDANSETRON HCL 4 MG/2ML IJ SOLN
4.0000 mg | Freq: Three times a day (TID) | INTRAMUSCULAR | Status: DC
  Administered 2015-04-23 – 2015-04-24 (×4): 4 mg via INTRAVENOUS
  Filled 2015-04-23 (×4): qty 2

## 2015-04-23 MED ORDER — KETOROLAC TROMETHAMINE 30 MG/ML IJ SOLN
30.0000 mg | Freq: Four times a day (QID) | INTRAMUSCULAR | Status: DC
  Administered 2015-04-23 – 2015-04-24 (×5): 30 mg via INTRAVENOUS
  Filled 2015-04-23 (×5): qty 1

## 2015-04-23 MED ORDER — ONDANSETRON HCL 4 MG/2ML IJ SOLN
4.0000 mg | Freq: Four times a day (QID) | INTRAMUSCULAR | Status: DC | PRN
  Administered 2015-04-23 – 2015-04-24 (×2): 4 mg via INTRAVENOUS
  Filled 2015-04-23 (×2): qty 2

## 2015-04-23 NOTE — Progress Notes (Signed)
[facility] Progress Note  Patient: Roger Hurley ZOX:[id number]   PCP: No PCP Per Patient DOB: [birth year]   DOA: 04/21/2015   DOS: 04/23/2015   Date of Service: the patient was seen and examined on 04/23/2015  Subjective: Patient remains sleepy throughout the day, mention that he had vomiting after eating lunch but it was not witnessed and he did not have any specimen. Recommended patient to call nurse when he had an episode of vomiting. Nutrition: Complains of persistent nausea and vomiting although the episodes have not been witnessed Activity: Bedridden at present Last BM: Prior to arrival  Assessment and Plan: 1. Bradycardia Patient presents with complaints of nausea vomiting. He does not have any focal deficit. Abdominal tenderness is present. Other than that he is also found to be having bradycardic episodes without any sinus pause or heart block. Possibly vagal response given his nausea and vomiting.   2. Intractable nausea and vomiting. Substance abuse Suspected opioid withdrawal. UDS is positive for cocaine. Avoiding clonidine for detoxification in the setting of bradycardia. Discontinuing Bentyl. Use IV Toradol scheduled, Zofran increased dose,  Do not use IV narcotics  X-ray abdomen is negative for any obstruction or acute intra-abdominal pathology, lactic acid 3 is negative, lipase normal, BMP does not show any evidence of acidosis as well. At present this does not appear to be any organic etiology which is causing abdominal pain. Monitor clinically. Discontinue lorazepam and Phenergan.   DVT Prophylaxis: subcutaneous Heparin Nutrition: Advance to diet as tolerated Advance goals of care discussion: Full code  Brief Summary of Hospitalization:  HPI: As per the H and P dictated on admission, "Roger Hurley is a 45 y.o. male with no significant past medical history who presents with pain all over after stopping oxycodone.  The patient was in his usual state of health  until the last week when he stopped taking daily oxycodone, which he obtained illicitly. Since then, he has developed pain "all over" (in the head, joints, abdomen), severe in intensity and constant, as well as diarrhea and vomiting. He presents requesting help with detox.  In the ED, the patient was noted to be bradycardic to the 30s and 40s but without hypotension or confusion. He was writhing in pain, begging for relief. K 3.8, Cr 1.1, transaminases/EtOH/acetaminophen/TNI normal. UDS only positive for cocaine.   He was accompanied by his aunt with whom he lives, his mother and a spiritual friend. 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 He denied symptoms of bradycardia, syncope, dizziness, weakness, dyspnea." Daily update, Procedures: clear liquid diet on 04/22/2015 Consultants: none Antibiotics: Anti-infectives    None       Family Communication: family was present at bedside, at the time of interview.   Disposition:  Expected discharge date: 04/24/2015 Barriers to safe discharge: Improvement in nausea, improvement in bradycardia   Intake/Output Summary (Last 24 hours) at 04/23/15 1941 Last data filed at 04/23/15 1830  Gross per 24 hour  Intake   2465 ml  Output      0 ml  Net   2465 ml   Filed Weights   04/21/15 2317  Weight: 88.4 kg (194 lb 14.2 oz)    Objective: Physical Exam: Filed Vitals:   04/22/15 1500 04/22/15 2220 04/23/15 0603 04/23/15 1421  BP: 138/64 129/59 138/72 129/54  Pulse: 51 49 50 46  Temp:  98 F (36.7 C) 98.5 F (36.9 C) 98.3 F (36.8 C)  TempSrc:  Oral Oral Oral  Resp: Height:  Weight:      SpO2: 100% 100% 100% 100%     General: Appear in moderate distress, no Rash; Oral Mucosa moist. Cardiovascular: S1 and S2 Present, no Murmur, no JVD Respiratory: Bilateral Air entry present and Clear to Auscultation, no Crackles, no wheezes Abdomen: Bowel Sound present, Soft and diffuse tenderness Extremities: no Pedal edema, no calf  tendernes  Data Reviewed: CBC:  Recent Labs Lab 04/21/15 1640 04/21/15 1740 04/22/15 0543 04/23/15 0606  WBC 9.5  --  12.4* 10.6*  HGB 14.3 15.0 13.2 13.0  HCT 42.5 44.0 38.8* 39.1  MCV 90.6  --  90.4 92.4  PLT 216  --  217 221   Basic Metabolic Panel:  Recent Labs Lab 04/21/15 1640 04/21/15 1740 04/21/15 2330 04/22/15 0543 04/23/15 0606  NA 137 141  --  139 140  K 3.8 3.7  --  4.1 3.6  CL 103 103  --  106 105  CO2 24  --   --  26 28  GLUCOSE 136* 125*  --  120* 113*  BUN 10 9  --  9 10  CREATININE 1.09 1.00  --  1.08 1.23  CALCIUM 9.7  --   --  9.2 9.2  MG  --   --  1.7  --  1.7   Liver Function Tests:  Recent Labs Lab 04/21/15 1640  AST 28  ALT 30  ALKPHOS 63  BILITOT 0.4  PROT 7.6  ALBUMIN 4.2    Recent Labs Lab 04/22/15 0947  LIPASE 42   No results for input(s): AMMONIA in the last 168 hours.  Cardiac Enzymes:  Recent Labs Lab 04/21/15 1645 04/21/15 2330 04/22/15 0543  TROPONINI <0.03 <0.03 0.03    BNP (last 3 results) No results for input(s): BNP in the last 8760 hours.  CBG: No results for input(s): GLUCAP in the last 168 hours.  No results found for this or any previous visit (from the past 240 hour(s)).   Studies: No results found.   Scheduled Meds: . enoxaparin (LOVENOX) injection  40 mg Subcutaneous QHS  . ketorolac  30 mg Intravenous 4 times per day  . ondansetron (ZOFRAN) IV  4 mg Intravenous 3 times per day  . pantoprazole (PROTONIX) IV  40 mg Intravenous Q12H  . sodium chloride flush  3 mL Intravenous Q12H   Continuous Infusions: . sodium chloride 100 mL/hr at 04/23/15 1603   PRN Meds: acetaminophen, diphenhydrAMINE, hydrOXYzine, ibuprofen, ondansetron (ZOFRAN) IV  Time spent: 30 minutes  Author: Lynden Oxford, MD [facility] Pager: [phone] 04/23/2015 7:41 PM  If 7PM-7AM, please contact night-coverage at [email or web address], password [email or web address]

## 2015-04-23 NOTE — Consult Note (Signed)
CSW received consult for substance abuse resources/programs.  BSW Intern met with pt at bedside. BSW Intern introduced self and explained role. BSW Intern explained consult for substance abuse resources. BSW Intern provided pt with a list of inpatient and outpatient treatments and information on the difference between the two options. Pt drowsy but seemed interested in reviewing information given. Pt inquired about the location of the programs. BSW Intern directed pt to the addresses of each program.   Pt thanked Engineer, water for the resources. Pt reported no further questions at this time.   No further SW needs identified at this time.  BSW Intern signing off, Harlon Flor, [place] Work Department  [phone]

## 2015-04-24 ENCOUNTER — Encounter: Payer: Self-pay | Admitting: Internal Medicine

## 2015-04-24 ENCOUNTER — Observation Stay (HOSPITAL_COMMUNITY): Payer: 59

## 2015-04-24 DIAGNOSIS — F1910 Other psychoactive substance abuse, uncomplicated: Secondary | ICD-10-CM | POA: Diagnosis not present

## 2015-04-24 DIAGNOSIS — G43A1 Cyclical vomiting, intractable: Secondary | ICD-10-CM

## 2015-04-24 DIAGNOSIS — F1123 Opioid dependence with withdrawal: Secondary | ICD-10-CM

## 2015-04-24 DIAGNOSIS — R001 Bradycardia, unspecified: Secondary | ICD-10-CM | POA: Diagnosis not present

## 2015-04-24 DIAGNOSIS — R1084 Generalized abdominal pain: Secondary | ICD-10-CM | POA: Diagnosis not present

## 2015-04-24 LAB — BASIC METABOLIC PANEL
Anion gap: 8 (ref 5–15)
BUN: 8 mg/dL (ref 6–20)
CALCIUM: 9 mg/dL (ref 8.9–10.3)
CO2: 28 mmol/L (ref 22–32)
Chloride: 103 mmol/L (ref 101–111)
Creatinine, Ser: 1.2 mg/dL (ref 0.61–1.24)
GFR calc Af Amer: >60 mL/min (ref >60)
GLUCOSE: 102 mg/dL — AB (ref 65–99)
Potassium: 3.5 mmol/L (ref 3.5–5.1)
Sodium: 139 mmol/L (ref 135–145)

## 2015-04-24 LAB — MAGNESIUM: MAGNESIUM: 1.7 mg/dL (ref 1.7–2.4)

## 2015-04-24 MED ORDER — ACETAMINOPHEN 325 MG PO TABS: 650.0000 mg | ORAL_TABLET | Freq: Four times a day (QID) | ORAL | Status: DC | PRN

## 2015-04-24 MED ORDER — IBUPROFEN 600 MG PO TABS: 600.0000 mg | ORAL_TABLET | Freq: Four times a day (QID) | ORAL | Status: DC | PRN

## 2015-04-24 MED ORDER — AMITRIPTYLINE HCL 25 MG PO TABS: 25.0000 mg | ORAL_TABLET | Freq: Every day | ORAL | Status: DC

## 2015-04-24 MED ORDER — ONDANSETRON HCL 4 MG PO TABS: 4.0000 mg | ORAL_TABLET | Freq: Three times a day (TID) | ORAL | Status: DC | PRN

## 2015-04-24 MED ORDER — HYDROXYZINE HCL 25 MG PO TABS: 25.0000 mg | ORAL_TABLET | Freq: Three times a day (TID) | ORAL | Status: DC | PRN

## 2015-04-24 MED ORDER — PANTOPRAZOLE SODIUM 40 MG PO TBEC: 40.0000 mg | DELAYED_RELEASE_TABLET | Freq: Two times a day (BID) | ORAL | Status: DC

## 2015-04-24 NOTE — Evaluation (Signed)
Physical Therapy Evaluation Patient Details Name: Roger Hurley MRN: [id number] DOB: October 16, 1970 Today's Date: 04/24/2015   History of Present Illness  45 y.o. male with no significant past medical history who presents with pain all over after stopping oxycodone and admitted for bradycardia, intractable nausea and vomiting, Opiate withdrawal  Clinical Impression  Pt admitted with above diagnosis. Pt currently with functional limitations due to the deficits listed below (see PT Problem List).  Pt will benefit from skilled PT to increase their independence and safety with mobility to allow discharge to the venue listed below.  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  Pt requiring min assist at this time for weakness however anticipate improvement in mobility with improvement in medical condition.     Follow Up Recommendations No PT follow up    Equipment Recommendations  None recommended by PT    Recommendations for Other Services       Precautions / Restrictions Precautions Precautions: Fall      Mobility  Bed Mobility Overal bed mobility: Needs Assistance Bed Mobility: Sit to Supine       Sit to supine: Supervision      Transfers Overall transfer level: Needs assistance Equipment used: None Transfers: Sit to/from Stand Sit to Stand: Min assist         General transfer comment: assist for controlling descent  Ambulation/Gait Ambulation/Gait assistance: Min assist Ambulation Distance (Feet): 180 Feet Assistive device: None Gait Pattern/deviations: Step-through pattern;Decreased stride length;Narrow base of support     General Gait Details: 90 feet x2 with standing rest break leaning against wall required due to fatigue and weakness, min assist as distance increased, HR 60 bpm during gait, pt with nausea and vomiting upon return to room Banker notified)  Stairs            Wheelchair Mobility    Modified Rankin  (Stroke Patients Only)       Balance                                             Pertinent Vitals/Pain Pain Assessment: 0-10 Pain Score: 7  Pain Location: "in my bones" Pain Descriptors / Indicators: Aching Pain Intervention(s): Limited activity within patient's tolerance;Repositioned;Monitored during session    Home Living Family/patient expects to be discharged to:: Private residence Living Arrangements: Other relatives Available Help at Discharge: Family           Home Equipment: None      Prior Function Level of Independence: Independent               Hand Dominance        Extremity/Trunk Assessment               Lower Extremity Assessment: Generalized weakness         Communication   Communication: No difficulties  Cognition Arousal/Alertness: Awake/alert Behavior During Therapy: WFL for tasks assessed/performed Overall Cognitive Status: Within Functional Limits for tasks assessed                      General Comments      Exercises        Assessment/Plan    PT Assessment Patient needs continued PT services  PT Diagnosis Difficulty walking   PT Problem List Decreased strength;Decreased mobility;Pain;Decreased activity tolerance  PT Treatment  Interventions DME instruction;Gait training;Functional mobility training;Patient/family education;Therapeutic activities;Therapeutic exercise   PT Goals (Current goals can be found in the Care Plan section) Acute Rehab PT Goals PT Goal Formulation: With patient Time For Goal Achievement: 05/01/15 Potential to Achieve Goals: Good    Frequency Min 3X/week   Barriers to discharge        Co-evaluation               End of Session Equipment Utilized During Treatment: Gait belt Activity Tolerance: Patient limited by fatigue Patient left: in bed;with call bell/phone within reach (RN to room and to place bed alarm) Nurse Communication: Mobility status     Functional Assessment Tool Used: clinical judgement Functional Limitation: Mobility: Walking and moving around Mobility: Walking and Moving Around Current Status (M5784): At least 20 percent but less than 40 percent impaired, limited or restricted Mobility: Walking and Moving Around Goal Status [phone]): At least 1 percent but less than 20 percent impaired, limited or restricted    Time: 1023-1037 PT Time Calculation (min) (ACUTE ONLY): 14 min   Charges:   PT Evaluation $PT Eval Low Complexity: 1 Procedure     PT G Codes:   PT G-Codes **NOT FOR INPATIENT CLASS** Functional Assessment Tool Used: clinical judgement Functional Limitation: Mobility: Walking and moving around Mobility: Walking and Moving Around Current Status (B2841): At least 20 percent but less than 40 percent impaired, limited or restricted Mobility: Walking and Moving Around Goal Status [phone]): At least 1 percent but less than 20 percent impaired, limited or restricted    Mallarie Voorhies,KATHrine E 04/24/2015, 11:04 AM Zenovia Jarred, PT, DPT 04/24/2015 Pager: [phone]

## 2015-04-25 NOTE — Discharge Summary (Signed)
[facility] Discharge Summary   Patient: Roger Hurley ZOX:[id number]   PCP: No PCP Per Patient DOB: November 25, 1970   Date of admission: 04/21/2015   Date of discharge: 04/24/2015    Discharge Diagnoses:  Principal Problem:   Bradycardia Active Problems:   Polysubstance abuse   Opiate withdrawal (HCC)   Abdominal pain   Intractable nausea and vomiting  Recommendations for Outpatient Follow-up:  1. Requested patient to follow up and establish care with PCP in one week   Follow-up Information    Schedule an appointment as soon as possible for a visit to follow up.   Why:  primary MD in 1 week      Please follow up.   Why:  You may call [phone] for Primary Care Doctor's appointment.     Diet recommendation: Regular diet  Activity: The patient is advised to gradually reintroduce usual activities.  Discharge Condition: good  History of present illness: As per the H and P dictated on admission, "Armend Hurley is a 45 y.o. male with no significant past medical history who presents with pain all over after stopping oxycodone.  The patient was in his usual state of health until the last week when he stopped taking daily oxycodone, which he obtained illicitly. Since then, he has developed pain "all over" (in the head, joints, abdomen), severe in intensity and constant, as well as diarrhea and vomiting. He presents requesting help with detox.  In the ED, the patient was noted to be bradycardic to the 30s and 40s but without hypotension or confusion. He was writhing in pain, begging for relief. K 3.8, Cr 1.1, transaminases/EtOH/acetaminophen/TNI normal. UDS only positive for cocaine.   He was accompanied by his aunt with whom he lives, his mother and a spiritual friend. 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 He denied symptoms of bradycardia, syncope, dizziness, weakness, dyspnea."  Hospital Course:  Summary of his active problems in the hospital is as following. Principal Problem:  Bradycardia  The patient presents with complaints of intractable nausea and vomiting. Along with that he was found to be having bradycardia. Patient was monitored on telemetry for 3 days. Patient remained bradycardic with a baseline heart rate in upper 40s, at rest and improvement to 60s on exertion. Patient complained of fatigue and tiredness but did not have any dizziness or passing out episode. Briefly discussed the case with cardiology and recommended sinus bradycardia with appropriate augmentation on exertion without any other acute event on telemetry would be appropriate for outpatient follow-up. Requested patient to follow up with PCP as an outpatient with further workup.  Active Problems:   Polysubstance abuse   Opiate withdrawal (HCC)   Abdominal pain   Intractable nausea and vomiting Patient primary complaint intractable nausea and vomiting as well as abdominal pain. It has been listed the patient has been on daily oxycodone on H&P although I do not find evidence of narcotic prescriptions for this patient.  Patient complained of significant abdominal pain although lactic acid as well as lipase are negative. X-ray as well as upper GI series are also negative. Patient continues to have regular bowel movement. Patient's laboratory workup also does not show any evidence of dehydration or elementary loss. Without tachycardia, opiate withdrawal is less likely. Urine is positive for cocaine and not for a few weeks. Discussed with gastroenterology, who recommends that if the patient's upper GI series is negative it is less likely primary GI issue driving patient's intractable nausea. Recommended patient to have outpatient follow-up. Requested patient  to follow-up with PCP for further workup.  All other chronic medical condition were stable during the hospitalization.  Patient was seen by physical therapy, who recommended no further therapy, On the day of the discharge the patient's vital  remained stable and he was able to tolerate oral diet, and no other acute medical condition were reported by patient. the patient was felt safe to be discharge at home with family.  Procedures and Results:  none   Consultations:  none  DISCHARGE MEDICATION: Discharge Medication List as of 04/24/2015  3:56 PM    START taking these medications   Details  hydrOXYzine (ATARAX/VISTARIL) 25 MG tablet Take 1 tablet (25 mg total) by mouth 3 (three) times daily as needed for itching, anxiety, nausea or vomiting., Starting 04/24/2015, Until Discontinued, Normal    ibuprofen (ADVIL,MOTRIN) 600 MG tablet Take 1 tablet (600 mg total) by mouth every 6 (six) hours as needed for headache, mild pain, moderate pain or cramping., Starting 04/24/2015, Until Discontinued, Normal    ondansetron (ZOFRAN) 4 MG tablet Take 1 tablet (4 mg total) by mouth every 8 (eight) hours as needed for nausea or vomiting., Starting 04/24/2015, Until Discontinued, Normal    pantoprazole (PROTONIX) 40 MG tablet Take 1 tablet (40 mg total) by mouth 2 (two) times daily before a meal., Starting 04/24/2015, Until Discontinued, Normal    acetaminophen (TYLENOL) 325 MG tablet Take 2 tablets (650 mg total) by mouth every 6 (six) hours as needed for mild pain, moderate pain or headache., Starting 04/24/2015, Until Discontinued, OTC      CONTINUE these medications which have CHANGED   Details  amitriptyline (ELAVIL) 25 MG tablet Take 1 tablet (25 mg total) by mouth at bedtime., Starting 04/24/2015, Until Discontinued, Normal       Allergies  Allergen Reactions  . Bee Venom Anaphylaxis  . Shrimp [Shellfish Allergy] Anaphylaxis    And other sea foods. Itchy tongue and throat  . Amoxicillin Itching  . Penicillins Other (See Comments)    Itching, nose peeling Has patient had a PCN reaction causing immediate rash, facial/tongue/throat swelling, SOB or lightheadedness with hypotension: No (pt did experience minor throat swelling) Has patient  had a PCN reaction causing severe rash involving mucus membranes or skin necrosis: No Has patient had a PCN reaction that required hospitalization:  No Has patient had a PCN reaction occurring within the last 10 years: No     Discharge Instructions    Diet general    Complete by:  As directed   Small frequent meals     Discharge instructions    Complete by:  As directed   It is important that you read following instructions as well as go over your medication list with RN to help you understand your care after this hospitalization.  Discharge Instructions: Please follow-up with PCP in one week Please take small frequent meals.  Please request your primary care physician to go over all Hospital Tests and Procedure/Radiological results at the follow up,  Please get all Hospital records sent to your PCP by signing hospital release before you go home.   Do not take more than prescribed Pain, Sleep and Anxiety Medications. You were cared for by a hospitalist during your hospital stay. If you have any questions about your discharge medications or the care you received while you were in the hospital after you are discharged, you can call the unit and ask to speak with the hospitalist on call if the hospitalist that took care of  you is not available.  Once you are discharged, your primary care physician will handle any further medical issues. Please note that NO REFILLS for any discharge medications will be authorized once you are discharged, as it is imperative that you return to your primary care physician (or establish a relationship with a primary care physician if you do not have one) for your aftercare needs so that they can reassess your need for medications and monitor your lab values. You Must read complete instructions/literature along with all the possible adverse reactions/side effects for all the Medicines you take and that have been prescribed to you. Take any new Medicines after you  have completely understood and accept all the possible adverse reactions/side effects. Wear Seat belts while driving. If you have smoked or chewed Tobacco in the last 2 yrs please stop smoking and/or stop any Recreational drug use.     Increase activity slowly    Complete by:  As directed           Discharge Exam: Filed Weights   04/21/15 2317  Weight: 88.4 kg (194 lb 14.2 oz)   Filed Vitals:   04/24/15 0505 04/24/15 1312  BP: 152/74 138/77  Pulse: 46 46  Temp: 98.4 F (36.9 C) 98.8 F (37.1 C)  Resp: 18 17   General: Appear in no distress, nno Rash; Oral Mucosa moist. Cardiovascular: S1 and S2 Present, no Murmur, no JVD Respiratory: Bilateral Air entry present and Clear to Auscultation, no Crackles, no wheezes Abdomen: Bowel Sound present, Soft and mild tenderness Extremities: no Pedal edema, no calf tenderness Neurology: Grossly no focal neuro deficit.  The results of significant diagnostics from this hospitalization (including imaging, microbiology, ancillary and laboratory) are listed below for reference.    Significant Diagnostic Studies: Dg Abd Acute W/chest  04/22/2015  CLINICAL DATA:  Nausea vomiting and abdominal pain.  Short of breath EXAM: DG ABDOMEN ACUTE W/ 1V CHEST COMPARISON:  Chest 07/01/2012 FINDINGS: The lungs are clear. No infiltrate effusion or edema. Moderate dextroscoliosis of the thoracic spine. Normal bowel gas pattern. No bowel obstruction or ileus. No free air. No renal calculi. No acute skeletal abnormality. Moderate degenerative change in the right hip. IMPRESSION: No acute abnormality in the chest or abdomen. Electronically Signed   By: Marlan Palau M.D.   On: 04/22/2015 10:52   Dg Hand Complete Left  04/17/2015  CLINICAL DATA:  Injured hand with Sol 1 day ago.  First PIP joint. EXAM: LEFT HAND - COMPLETE 3+ VIEW COMPARISON:  06/04/2009 FINDINGS: No acute bony abnormality. Specifically, no fracture, subluxation, or dislocation. Soft tissues are  intact. IMPRESSION: No acute bony abnormality. Electronically Signed   By: Charlett Nose M.D.   On: 04/17/2015 12:27   Dg Kayleen Memos  W/kub  04/24/2015  CLINICAL DATA:  Intractable nausea and vomiting.  Opiate withdrawal. EXAM: UPPER GI SERIES WITH KUB TECHNIQUE: After obtaining a scout radiograph a routine upper GI series was performed using thin barium FLUOROSCOPY TIME:  Radiation Exposure Index (as provided by the fluoroscopic device): 78 (KUB) + 553 microGy*m^2 COMPARISON:  04/22/2015 FINDINGS: Initial KUB unremarkable aside from mild levoconvex lumbar scoliosis and degenerative findings in the right hip. I performed a single contrast exam as I did not consider the patient capable of the double contrast exam due to nausea. The pharyngeal phase of swallowing appears normal. Primary peristaltic waves in the esophagus were normal on 4 out of 4 swallows. No significant esophageal narrowing was observed, but I was not able to  fully distend the distal esophagus due to the patient's difficulty with nausea. I can't completely exclude the possibility of distal esophageal narrowing at the GE junction, but a 13 mm barium tablet passed without delay through this area. Single contrast appearance of the stomach and duodenum unremarkable. Barium passed into the proximal small bowel. No reflux was observed during the exam. There is dextroconvex thoracic scoliosis. A 13 mm barium tablet passed without difficulty into the stomach. Due to the patient's difficulty with multiple swallows, IMPRESSION: 1. Overall no specific abnormality is identified to correlate with the patient's nausea. There is no significant esophageal dysmotility or reflux. I was not able to fully distend the distal esophagus due to the patient's nausea with drinking barium. However, a 13 mm barium tablet passed without delay through the distal esophagus and into the stomach. 2. Dextroconvex thoracic scoliosis and levoconvex lumbar scoliosis. Degenerative arthropathy  of the right hip. Electronically Signed   By: Gaylyn Rong M.D.   On: 04/24/2015 13:23    Microbiology: No results found for this or any previous visit (from the past 240 hour(s)).   Labs: CBC:  Recent Labs Lab 04/21/15 1640 04/21/15 1740 04/22/15 0543 04/23/15 0606  WBC 9.5  --  12.4* 10.6*  HGB 14.3 15.0 13.2 13.0  HCT 42.5 44.0 38.8* 39.1  MCV 90.6  --  90.4 92.4  PLT 216  --  217 221   Basic Metabolic Panel:  Recent Labs Lab 04/21/15 1640 04/21/15 1740 04/21/15 2330 04/22/15 0543 04/23/15 0606 04/24/15 0513  NA 137 141  --  139 140 139  K 3.8 3.7  --  4.1 3.6 3.5  CL 103 103  --  106 105 103  CO2 24  --   --  GLUCOSE 136* 125*  --  120* 113* 102*  BUN 10 9  --  CREATININE 1.09 1.00  --  1.08 1.23 1.20  CALCIUM 9.7  --   --  9.2 9.2 9.0  MG  --   --  1.7  --  1.7 1.7   Liver Function Tests:  Recent Labs Lab 04/21/15 1640  AST 28  ALT 30  ALKPHOS 63  BILITOT 0.4  PROT 7.6  ALBUMIN 4.2    Recent Labs Lab 04/22/15 0947  LIPASE 42   No results for input(s): AMMONIA in the last 168 hours. Cardiac Enzymes:  Recent Labs Lab 04/21/15 1645 04/21/15 2330 04/22/15 0543  TROPONINI <0.03 <0.03 0.03   Time spent: 30 minutes  Signed:  Lynden Oxford  [facility] 04/24/2015, 7:54 AM

## 2015-05-22 ENCOUNTER — Ambulatory Visit: Payer: Self-pay | Admitting: Internal Medicine

## 2015-06-04 ENCOUNTER — Other Ambulatory Visit: Payer: Self-pay | Admitting: *Deleted

## 2015-06-04 DIAGNOSIS — I83813 Varicose veins of bilateral lower extremities with pain: Secondary | ICD-10-CM

## 2015-07-20 ENCOUNTER — Encounter: Payer: Self-pay | Admitting: Vascular Surgery

## 2015-07-26 ENCOUNTER — Encounter: Payer: Self-pay | Admitting: Vascular Surgery

## 2015-07-26 ENCOUNTER — Ambulatory Visit (HOSPITAL_COMMUNITY): Admission: RE | Admit: 2015-07-26 | Payer: 59 | Source: Ambulatory Visit

## 2015-07-27 ENCOUNTER — Encounter: Payer: Self-pay | Admitting: Surgery

## 2015-08-03 ENCOUNTER — Encounter: Payer: Self-pay | Admitting: Surgery

## 2015-08-03 ENCOUNTER — Ambulatory Visit (INDEPENDENT_AMBULATORY_CARE_PROVIDER_SITE_OTHER): Payer: 59 | Admitting: Surgery

## 2015-08-03 ENCOUNTER — Ambulatory Visit (HOSPITAL_COMMUNITY)
Admission: RE | Admit: 2015-08-03 | Discharge: 2015-08-03 | Disposition: A | Discharge status: Home / Self Care | Payer: 59 | Source: Ambulatory Visit | Attending: Vascular Surgery | Admitting: Vascular Surgery

## 2015-08-03 VITALS — BP 140/86 | HR 80 | Temp 98.2°F | Resp 16 | Ht 73.0 in | Wt 194.0 lb

## 2015-08-03 DIAGNOSIS — I83893 Varicose veins of bilateral lower extremities with other complications: Secondary | ICD-10-CM | POA: Diagnosis not present

## 2015-08-03 DIAGNOSIS — I83813 Varicose veins of bilateral lower extremities with pain: Secondary | ICD-10-CM | POA: Insufficient documentation
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
## 2015-08-07 ENCOUNTER — Encounter: Payer: Self-pay | Admitting: Internal Medicine

## 2015-11-05 ENCOUNTER — Encounter: Payer: Self-pay | Admitting: Vascular Surgery

## 2015-11-06 ENCOUNTER — Encounter: Payer: Self-pay | Admitting: Vascular Surgery

## 2015-11-06 ENCOUNTER — Ambulatory Visit (INDEPENDENT_AMBULATORY_CARE_PROVIDER_SITE_OTHER): Payer: BLUE CROSS/BLUE SHIELD | Admitting: Vascular Surgery

## 2015-11-06 VITALS — BP 138/85 | HR 62 | Temp 97.7°F | Resp 18 | Ht 73.0 in | Wt 200.0 lb

## 2015-11-06 DIAGNOSIS — I83891 Varicose veins of right lower extremities with other complications: Secondary | ICD-10-CM
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
## 2015-11-14 ENCOUNTER — Other Ambulatory Visit: Payer: Self-pay | Admitting: *Deleted

## 2015-11-14 DIAGNOSIS — I83811 Varicose veins of right lower extremities with pain: Secondary | ICD-10-CM

## 2015-11-29 ENCOUNTER — Encounter: Payer: Self-pay | Admitting: Vascular Surgery

## 2015-12-03 ENCOUNTER — Other Ambulatory Visit: Payer: Self-pay | Admitting: Vascular Surgery

## 2015-12-10 ENCOUNTER — Encounter (HOSPITAL_COMMUNITY): Payer: Self-pay

## 2015-12-10 ENCOUNTER — Ambulatory Visit: Payer: Self-pay | Admitting: Vascular Surgery

## 2015-12-18 ENCOUNTER — Other Ambulatory Visit: Payer: Self-pay | Admitting: Family Medicine

## 2015-12-18 ENCOUNTER — Ambulatory Visit
Admission: RE | Admit: 2015-12-18 | Discharge: 2015-12-18 | Disposition: A | Discharge status: Home / Self Care | Payer: Self-pay | Source: Ambulatory Visit | Attending: Family Medicine | Admitting: Family Medicine

## 2015-12-18 DIAGNOSIS — M25512 Pain in left shoulder: Secondary | ICD-10-CM

## 2015-12-18 DIAGNOSIS — M542 Cervicalgia: Secondary | ICD-10-CM

## 2015-12-25 DIAGNOSIS — J9690 Respiratory failure, unspecified, unspecified whether with hypoxia or hypercapnia: Secondary | ICD-10-CM | POA: Diagnosis not present

## 2015-12-25 DIAGNOSIS — R410 Disorientation, unspecified: Secondary | ICD-10-CM | POA: Diagnosis not present

## 2015-12-25 DIAGNOSIS — R748 Abnormal levels of other serum enzymes: Secondary | ICD-10-CM

## 2015-12-25 DIAGNOSIS — F1910 Other psychoactive substance abuse, uncomplicated: Secondary | ICD-10-CM | POA: Diagnosis not present

## 2015-12-25 DIAGNOSIS — Z9889 Other specified postprocedural states: Secondary | ICD-10-CM

## 2015-12-25 DIAGNOSIS — N179 Acute kidney failure, unspecified: Secondary | ICD-10-CM

## 2015-12-26 DIAGNOSIS — J9690 Respiratory failure, unspecified, unspecified whether with hypoxia or hypercapnia: Secondary | ICD-10-CM | POA: Diagnosis not present

## 2015-12-26 DIAGNOSIS — R410 Disorientation, unspecified: Secondary | ICD-10-CM | POA: Diagnosis not present

## 2015-12-26 DIAGNOSIS — Z9889 Other specified postprocedural states: Secondary | ICD-10-CM | POA: Diagnosis not present

## 2015-12-26 DIAGNOSIS — F1910 Other psychoactive substance abuse, uncomplicated: Secondary | ICD-10-CM | POA: Diagnosis not present

## 2015-12-27 DIAGNOSIS — J9690 Respiratory failure, unspecified, unspecified whether with hypoxia or hypercapnia: Secondary | ICD-10-CM | POA: Diagnosis not present

## 2015-12-27 DIAGNOSIS — R410 Disorientation, unspecified: Secondary | ICD-10-CM | POA: Diagnosis not present

## 2015-12-27 DIAGNOSIS — F1910 Other psychoactive substance abuse, uncomplicated: Secondary | ICD-10-CM | POA: Diagnosis not present

## 2015-12-27 DIAGNOSIS — Z9889 Other specified postprocedural states: Secondary | ICD-10-CM | POA: Diagnosis not present

## 2016-01-07 ENCOUNTER — Other Ambulatory Visit: Payer: Self-pay | Admitting: Vascular Surgery

## 2016-01-14 ENCOUNTER — Ambulatory Visit: Payer: Self-pay | Admitting: Vascular Surgery

## 2016-01-14 ENCOUNTER — Encounter (HOSPITAL_COMMUNITY): Payer: Self-pay

## 2016-07-11 ENCOUNTER — Encounter (HOSPITAL_COMMUNITY): Payer: Self-pay

## 2016-07-11 ENCOUNTER — Emergency Department (HOSPITAL_COMMUNITY): Payer: Worker's Compensation

## 2016-07-11 ENCOUNTER — Emergency Department (HOSPITAL_COMMUNITY)
Admission: EM | Admit: 2016-07-11 | Discharge: 2016-07-12 | Disposition: A | Discharge status: Home / Self Care | Payer: Worker's Compensation | Attending: Emergency Medicine | Admitting: Emergency Medicine

## 2016-07-11 DIAGNOSIS — Z79899 Other long term (current) drug therapy: Secondary | ICD-10-CM | POA: Diagnosis not present

## 2016-07-11 DIAGNOSIS — W19XXXA Unspecified fall, initial encounter: Secondary | ICD-10-CM

## 2016-07-11 DIAGNOSIS — S8001XA Contusion of right knee, initial encounter: Secondary | ICD-10-CM | POA: Insufficient documentation

## 2016-07-11 DIAGNOSIS — W1789XA Other fall from one level to another, initial encounter: Secondary | ICD-10-CM | POA: Insufficient documentation

## 2016-07-11 DIAGNOSIS — F17210 Nicotine dependence, cigarettes, uncomplicated: Secondary | ICD-10-CM | POA: Insufficient documentation

## 2016-07-11 DIAGNOSIS — Y999 Unspecified external cause status: Secondary | ICD-10-CM | POA: Insufficient documentation

## 2016-07-11 DIAGNOSIS — Y929 Unspecified place or not applicable: Secondary | ICD-10-CM | POA: Diagnosis not present

## 2016-07-11 DIAGNOSIS — M545 Low back pain, unspecified: Secondary | ICD-10-CM

## 2016-07-11 DIAGNOSIS — S43401A Unspecified sprain of right shoulder joint, initial encounter: Secondary | ICD-10-CM | POA: Diagnosis not present

## 2016-07-11 DIAGNOSIS — Y939 Activity, unspecified: Secondary | ICD-10-CM | POA: Diagnosis not present

## 2016-07-11 DIAGNOSIS — S4991XA Unspecified injury of right shoulder and upper arm, initial encounter: Secondary | ICD-10-CM | POA: Diagnosis present

## 2016-07-11 MED ORDER — OXYCODONE-ACETAMINOPHEN 5-325 MG PO TABS
1.0000 | ORAL_TABLET | Freq: Once | ORAL | Status: AC
  Administered 2016-07-11: 1 via ORAL
  Filled 2016-07-11: qty 1
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
## 2016-07-11 NOTE — ED Notes (Signed)
EDP at bedside  
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
## 2016-07-12 MED ORDER — METHOCARBAMOL 500 MG PO TABS: 1000.0000 mg | ORAL_TABLET | Freq: Four times a day (QID) | ORAL | 0 refills | Status: DC

## 2016-07-12 MED ORDER — METHOCARBAMOL 500 MG PO TABS
1000.0000 mg | ORAL_TABLET | Freq: Once | ORAL | Status: AC
  Administered 2016-07-12: 1000 mg via ORAL
  Filled 2016-07-12: qty 2

## 2016-07-12 MED ORDER — NAPROXEN 500 MG PO TABS: 500.0000 mg | ORAL_TABLET | Freq: Two times a day (BID) | ORAL | 0 refills | Status: DC

## 2016-07-12 NOTE — Discharge Instructions (Signed)
Please read and follow all provided instructions.  Your diagnoses today include:  1. Fall, initial encounter   2. Acute bilateral low back pain without sciatica   3. Sprain of right shoulder, unspecified shoulder sprain type, initial encounter   4. Contusion of right knee, initial encounter     Tests performed today include:  X-rays of the affected areas - do NOT show any broken bones  Vital signs. See below for your results today.   Medications prescribed:   Robaxin (methocarbamol) - muscle relaxer medication  DO NOT drive or perform any activities that require you to be awake and alert because this medicine can make you drowsy.    Naproxen - anti-inflammatory pain medication  Do not exceed  naproxen every 12 hours, take with food  You have been prescribed an anti-inflammatory medication or NSAID. Take with food. Take smallest effective dose for the shortest duration needed for your pain. Stop taking if you experience stomach pain or vomiting.   Take any prescribed medications only as directed.  Home care instructions:   Follow any educational materials contained in this packet  Follow R.I.C.E. Protocol:  R - rest your injury   I  - use ice on injury without applying directly to skin  C - compress injury with bandage or splint  E - elevate the injury as much as possible  Follow-up instructions: Please follow-up with your primary care provider if you continue to have significant pain in 1 week. In this case you may have a more severe injury that requires further care.   Return instructions:   Please return if your toes or feet are numb or tingling, appear gray or blue, or you have severe pain (also elevate the leg and loosen splint or wrap if you were given one)  Please return to the Emergency Department if you experience worsening symptoms.   Please return if you have any other emergent concerns.  Additional Information:  Your vital signs today were: BP  (!) 153/135    Pulse (!) 59    Temp 98.5 F (36.9 C) (Oral)    Resp 14    Ht  (1.854 m)    Wt 90.7 kg    SpO2 99%    BMI 26.39 kg/m  If your blood pressure (BP) was elevated above 135/85 this visit, please have this repeated by your doctor within one month. --------------

## 2016-07-12 NOTE — ED Notes (Signed)
Pt ambulated in hall with unsteady gait. Taking small steps and complaining of back and shoulder pain. Pt unable to walk fully independently without 1 assist.

## 2016-07-12 NOTE — ED Notes (Signed)
Sling applied to R arm, pt verbalized understanding of sling care and d/c instructions. Friend at bedside for ride home

## 2016-07-13 ENCOUNTER — Emergency Department (HOSPITAL_BASED_OUTPATIENT_CLINIC_OR_DEPARTMENT_OTHER)
Admission: EM | Admit: 2016-07-13 | Discharge: 2016-07-13 | Disposition: A | Discharge status: Home / Self Care | Payer: Worker's Compensation | Attending: Emergency Medicine | Admitting: Emergency Medicine

## 2016-07-13 ENCOUNTER — Encounter (HOSPITAL_BASED_OUTPATIENT_CLINIC_OR_DEPARTMENT_OTHER): Payer: Self-pay | Admitting: Emergency Medicine

## 2016-07-13 DIAGNOSIS — W1839XA Other fall on same level, initial encounter: Secondary | ICD-10-CM | POA: Insufficient documentation

## 2016-07-13 DIAGNOSIS — Z79899 Other long term (current) drug therapy: Secondary | ICD-10-CM | POA: Insufficient documentation

## 2016-07-13 DIAGNOSIS — Y9389 Activity, other specified: Secondary | ICD-10-CM | POA: Diagnosis not present

## 2016-07-13 DIAGNOSIS — F17210 Nicotine dependence, cigarettes, uncomplicated: Secondary | ICD-10-CM | POA: Diagnosis not present

## 2016-07-13 DIAGNOSIS — M545 Low back pain, unspecified: Secondary | ICD-10-CM

## 2016-07-13 DIAGNOSIS — Y990 Civilian activity done for income or pay: Secondary | ICD-10-CM | POA: Diagnosis not present

## 2016-07-13 DIAGNOSIS — M25511 Pain in right shoulder: Secondary | ICD-10-CM | POA: Insufficient documentation

## 2016-07-13 DIAGNOSIS — W19XXXA Unspecified fall, initial encounter: Secondary | ICD-10-CM

## 2016-07-13 DIAGNOSIS — Y929 Unspecified place or not applicable: Secondary | ICD-10-CM | POA: Diagnosis not present

## 2016-07-13 DIAGNOSIS — S3992XA Unspecified injury of lower back, initial encounter: Secondary | ICD-10-CM | POA: Diagnosis present

## 2016-07-13 MED ORDER — MELATONIN 3 MG PO CAPS: 3.0000 mg | ORAL_CAPSULE | Freq: Every evening | ORAL | 0 refills | Status: AC | PRN

## 2016-07-13 MED ORDER — DICLOFENAC SODIUM 1 % TD GEL: 2.0000 g | Freq: Four times a day (QID) | TRANSDERMAL | 0 refills | Status: DC
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
## 2016-07-13 NOTE — Discharge Instructions (Signed)
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
## 2018-12-31 ENCOUNTER — Other Ambulatory Visit: Payer: Self-pay

## 2018-12-31 ENCOUNTER — Emergency Department (HOSPITAL_COMMUNITY)
Admission: EM | Admit: 2018-12-31 | Discharge: 2018-12-31 | Disposition: A | Discharge status: Home / Self Care | Payer: BLUE CROSS/BLUE SHIELD | Attending: Emergency Medicine | Admitting: Emergency Medicine

## 2018-12-31 ENCOUNTER — Encounter (HOSPITAL_COMMUNITY): Payer: Self-pay

## 2018-12-31 DIAGNOSIS — R112 Nausea with vomiting, unspecified: Secondary | ICD-10-CM | POA: Insufficient documentation

## 2018-12-31 DIAGNOSIS — Z79899 Other long term (current) drug therapy: Secondary | ICD-10-CM | POA: Insufficient documentation

## 2018-12-31 DIAGNOSIS — F17210 Nicotine dependence, cigarettes, uncomplicated: Secondary | ICD-10-CM | POA: Insufficient documentation

## 2018-12-31 LAB — CBC
HCT: 44.5 % (ref 39.0–52.0)
Hemoglobin: 15 g/dL (ref 13.0–17.0)
MCH: 32.1 pg (ref 26.0–34.0)
MCHC: 33.7 g/dL (ref 30.0–36.0)
MCV: 95.3 fL (ref 80.0–100.0)
Platelets: 256 10*3/uL (ref 150–400)
RBC: 4.67 MIL/uL (ref 4.22–5.81)
RDW: 13.5 % (ref 11.5–15.5)
WBC: 12.6 10*3/uL — ABNORMAL HIGH (ref 4.0–10.5)
nRBC: 0 % (ref 0.0–0.2)

## 2018-12-31 LAB — COMPREHENSIVE METABOLIC PANEL
ALT: 13 U/L (ref 0–44)
AST: 13 U/L — ABNORMAL LOW (ref 15–41)
Albumin: 4.3 g/dL (ref 3.5–5.0)
Alkaline Phosphatase: 64 U/L (ref 38–126)
Anion gap: 11 (ref 5–15)
BUN: 11 mg/dL (ref 6–20)
CO2: 28 mmol/L (ref 22–32)
Calcium: 9.6 mg/dL (ref 8.9–10.3)
Chloride: 99 mmol/L (ref 98–111)
Creatinine, Ser: 1.38 mg/dL — ABNORMAL HIGH (ref 0.61–1.24)
GFR calc Af Amer: >60 mL/min (ref >60)
GFR calc non Af Amer: >60 mL/min (ref >60)
Glucose, Bld: 118 mg/dL — ABNORMAL HIGH (ref 70–99)
Potassium: 4.1 mmol/L (ref 3.5–5.1)
Sodium: 138 mmol/L (ref 135–145)
Total Bilirubin: 0.5 mg/dL (ref 0.3–1.2)
Total Protein: 7.8 g/dL (ref 6.5–8.1)

## 2018-12-31 LAB — TYPE AND SCREEN
ABO/RH(D): B POS
Antibody Screen: NEGATIVE

## 2018-12-31 LAB — LIPASE, BLOOD: Lipase: 34 U/L (ref 11–51)

## 2018-12-31 MED ORDER — SODIUM CHLORIDE 0.9 % IV BOLUS
2000.0000 mL | Freq: Once | INTRAVENOUS | Status: AC
  Administered 2018-12-31: 2000 mL via INTRAVENOUS

## 2018-12-31 MED ORDER — SODIUM CHLORIDE 0.9 % IV SOLN: INTRAVENOUS | Status: DC

## 2018-12-31 MED ORDER — HALOPERIDOL LACTATE 5 MG/ML IJ SOLN
2.0000 mg | Freq: Once | INTRAMUSCULAR | Status: AC
  Administered 2018-12-31: 2 mg via INTRAVENOUS
  Filled 2018-12-31: qty 1

## 2018-12-31 MED ORDER — DIPHENHYDRAMINE HCL 50 MG/ML IJ SOLN
25.0000 mg | Freq: Once | INTRAMUSCULAR | Status: AC
  Administered 2018-12-31: 25 mg via INTRAVENOUS
  Filled 2018-12-31: qty 1

## 2018-12-31 MED ORDER — LORAZEPAM 2 MG/ML IJ SOLN
0.5000 mg | Freq: Once | INTRAMUSCULAR | Status: AC
  Administered 2018-12-31: 0.5 mg via INTRAVENOUS
  Filled 2018-12-31: qty 1

## 2018-12-31 NOTE — ED Notes (Signed)
MD made away of trending heart rate.
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
## 2019-01-01 LAB — ABO/RH: ABO/RH(D): B POS

## 2019-02-13 ENCOUNTER — Emergency Department (HOSPITAL_BASED_OUTPATIENT_CLINIC_OR_DEPARTMENT_OTHER)
Admission: EM | Admit: 2019-02-13 | Discharge: 2019-02-13 | Disposition: A | Discharge status: Home / Self Care | Payer: Self-pay | Attending: Emergency Medicine | Admitting: Emergency Medicine

## 2019-02-13 ENCOUNTER — Other Ambulatory Visit: Payer: Self-pay

## 2019-02-13 ENCOUNTER — Encounter (HOSPITAL_BASED_OUTPATIENT_CLINIC_OR_DEPARTMENT_OTHER): Payer: Self-pay | Admitting: *Deleted

## 2019-02-13 DIAGNOSIS — Z880 Allergy status to penicillin: Secondary | ICD-10-CM | POA: Insufficient documentation

## 2019-02-13 DIAGNOSIS — Z79899 Other long term (current) drug therapy: Secondary | ICD-10-CM | POA: Insufficient documentation

## 2019-02-13 DIAGNOSIS — Z91030 Bee allergy status: Secondary | ICD-10-CM | POA: Insufficient documentation

## 2019-02-13 DIAGNOSIS — F17210 Nicotine dependence, cigarettes, uncomplicated: Secondary | ICD-10-CM | POA: Insufficient documentation

## 2019-02-13 DIAGNOSIS — Z978 Presence of other specified devices: Secondary | ICD-10-CM

## 2019-02-13 DIAGNOSIS — Z0289 Encounter for other administrative examinations: Secondary | ICD-10-CM | POA: Insufficient documentation

## 2019-02-13 DIAGNOSIS — Z91013 Allergy to seafood: Secondary | ICD-10-CM | POA: Insufficient documentation

## 2019-02-13 NOTE — ED Notes (Signed)
#  18 saline lock removed. Jelco intact. No signs of infection. Tolerated well.
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
## 2019-02-13 NOTE — Discharge Instructions (Addendum)
Your IV was successfully removed.  There were no signs or symptoms of infection.

## 2019-02-13 NOTE — ED Triage Notes (Addendum)
Pt states he was d/c last night from [facility] and the saline lock was not removed. Is here to have his saline lock removed. No other complaints.

## 2019-04-25 ENCOUNTER — Emergency Department (HOSPITAL_BASED_OUTPATIENT_CLINIC_OR_DEPARTMENT_OTHER)
Admission: EM | Admit: 2019-04-25 | Discharge: 2019-04-25 | Disposition: A | Discharge status: Home / Self Care | Payer: Self-pay | Attending: Emergency Medicine | Admitting: Emergency Medicine

## 2019-04-25 ENCOUNTER — Encounter (HOSPITAL_BASED_OUTPATIENT_CLINIC_OR_DEPARTMENT_OTHER): Payer: Self-pay

## 2019-04-25 ENCOUNTER — Other Ambulatory Visit: Payer: Self-pay

## 2019-04-25 ENCOUNTER — Emergency Department (HOSPITAL_BASED_OUTPATIENT_CLINIC_OR_DEPARTMENT_OTHER): Payer: Self-pay

## 2019-04-25 DIAGNOSIS — Z91013 Allergy to seafood: Secondary | ICD-10-CM | POA: Insufficient documentation

## 2019-04-25 DIAGNOSIS — M79602 Pain in left arm: Secondary | ICD-10-CM | POA: Insufficient documentation

## 2019-04-25 DIAGNOSIS — Z880 Allergy status to penicillin: Secondary | ICD-10-CM | POA: Insufficient documentation

## 2019-04-25 DIAGNOSIS — Z91030 Bee allergy status: Secondary | ICD-10-CM | POA: Insufficient documentation

## 2019-04-25 DIAGNOSIS — M79601 Pain in right arm: Secondary | ICD-10-CM | POA: Insufficient documentation

## 2019-04-25 DIAGNOSIS — R55 Syncope and collapse: Secondary | ICD-10-CM | POA: Insufficient documentation

## 2019-04-25 DIAGNOSIS — Z79899 Other long term (current) drug therapy: Secondary | ICD-10-CM | POA: Insufficient documentation

## 2019-04-25 DIAGNOSIS — F17210 Nicotine dependence, cigarettes, uncomplicated: Secondary | ICD-10-CM | POA: Insufficient documentation

## 2019-04-25 MED ORDER — HYDROCODONE-ACETAMINOPHEN 5-325 MG PO TABS: 1.0000 | ORAL_TABLET | Freq: Once | ORAL | Status: DC

## 2019-04-25 MED ORDER — DEXAMETHASONE 6 MG PO TABS
10.0000 mg | ORAL_TABLET | Freq: Once | ORAL | Status: AC
  Administered 2019-04-25: 10 mg via ORAL
  Filled 2019-04-25: qty 1

## 2019-04-25 MED ORDER — NAPROXEN 250 MG PO TABS
500.0000 mg | ORAL_TABLET | Freq: Once | ORAL | Status: AC
  Administered 2019-04-25: 500 mg via ORAL
  Filled 2019-04-25: qty 2

## 2019-04-25 MED ORDER — GABAPENTIN 300 MG PO CAPS: 300.0000 mg | ORAL_CAPSULE | Freq: Three times a day (TID) | ORAL | 0 refills | Status: DC

## 2019-04-25 MED ORDER — SODIUM CHLORIDE 0.9 % IV SOLN
1000.00 | INTRAVENOUS | Status: DC
Start: 2019-04-24 — End: 2019-04-25

## 2019-04-25 NOTE — ED Triage Notes (Signed)
During triage when asked about Covid symptoms, pt also reports that he has been having NVD since last week.

## 2019-04-25 NOTE — ED Triage Notes (Signed)
Pt arrived ambulatory to ED with c/o bilateral arm pain and laceration to right index finger after a fall yesterday. Pt reports he was getting up from a hot bath and slipped and fell hitting his head.
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
## 2019-04-25 NOTE — Discharge Instructions (Addendum)
Start Gabapentin for nerve pain - take three times daily. This medicine can make you drowsy Take Aleve as needed for pain or inflammation Follow up with your doctor

## 2019-05-04 ENCOUNTER — Encounter (HOSPITAL_BASED_OUTPATIENT_CLINIC_OR_DEPARTMENT_OTHER): Payer: Self-pay | Admitting: Emergency Medicine

## 2019-05-04 ENCOUNTER — Other Ambulatory Visit: Payer: Self-pay

## 2019-05-04 ENCOUNTER — Emergency Department (HOSPITAL_BASED_OUTPATIENT_CLINIC_OR_DEPARTMENT_OTHER)
Admission: EM | Admit: 2019-05-04 | Discharge: 2019-05-04 | Disposition: A | Discharge status: Home / Self Care | Payer: Self-pay | Attending: Emergency Medicine | Admitting: Emergency Medicine

## 2019-05-04 DIAGNOSIS — Z79899 Other long term (current) drug therapy: Secondary | ICD-10-CM | POA: Insufficient documentation

## 2019-05-04 DIAGNOSIS — F17210 Nicotine dependence, cigarettes, uncomplicated: Secondary | ICD-10-CM | POA: Insufficient documentation

## 2019-05-04 DIAGNOSIS — L732 Hidradenitis suppurativa: Secondary | ICD-10-CM | POA: Insufficient documentation

## 2019-05-04 HISTORY — DX: Opioid abuse, uncomplicated: F11.10

## 2019-05-04 HISTORY — DX: Other psychoactive substance abuse, uncomplicated: F19.10

## 2019-05-04 MED ORDER — CLINDAMYCIN HCL 300 MG PO CAPS: 300.0000 mg | ORAL_CAPSULE | Freq: Four times a day (QID) | ORAL | 0 refills | Status: DC

## 2019-05-04 MED ORDER — CLINDAMYCIN HCL 150 MG PO CAPS
300.0000 mg | ORAL_CAPSULE | Freq: Once | ORAL | Status: AC
  Administered 2019-05-04: 300 mg via ORAL
  Filled 2019-05-04: qty 2

## 2019-05-04 NOTE — ED Triage Notes (Signed)
Pt reports painful swollen bump in right axilla.
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
## 2020-03-17 DIAGNOSIS — I2699 Other pulmonary embolism without acute cor pulmonale: Secondary | ICD-10-CM

## 2020-03-17 HISTORY — DX: Other pulmonary embolism without acute cor pulmonale: I26.99

## 2020-04-05 ENCOUNTER — Other Ambulatory Visit: Payer: Self-pay

## 2020-04-05 ENCOUNTER — Encounter (HOSPITAL_BASED_OUTPATIENT_CLINIC_OR_DEPARTMENT_OTHER): Payer: Self-pay | Admitting: *Deleted

## 2020-04-05 ENCOUNTER — Emergency Department (HOSPITAL_BASED_OUTPATIENT_CLINIC_OR_DEPARTMENT_OTHER): Payer: Self-pay

## 2020-04-05 ENCOUNTER — Emergency Department (HOSPITAL_BASED_OUTPATIENT_CLINIC_OR_DEPARTMENT_OTHER)
Admission: EM | Admit: 2020-04-05 | Discharge: 2020-04-05 | Discharge status: Against Medical Advice | Payer: Self-pay | Attending: Emergency Medicine | Admitting: Emergency Medicine

## 2020-04-05 DIAGNOSIS — I2699 Other pulmonary embolism without acute cor pulmonale: Secondary | ICD-10-CM | POA: Insufficient documentation

## 2020-04-05 DIAGNOSIS — I809 Phlebitis and thrombophlebitis of unspecified site: Secondary | ICD-10-CM

## 2020-04-05 DIAGNOSIS — Z87828 Personal history of other (healed) physical injury and trauma: Secondary | ICD-10-CM | POA: Insufficient documentation

## 2020-04-05 DIAGNOSIS — I82611 Acute embolism and thrombosis of superficial veins of right upper extremity: Secondary | ICD-10-CM | POA: Insufficient documentation

## 2020-04-05 DIAGNOSIS — F17210 Nicotine dependence, cigarettes, uncomplicated: Secondary | ICD-10-CM | POA: Insufficient documentation

## 2020-04-05 LAB — CBC WITH DIFFERENTIAL/PLATELET
Abs Immature Granulocytes: 0.03 10*3/uL (ref 0.00–0.07)
Basophils Absolute: 0 10*3/uL (ref 0.0–0.1)
Basophils Relative: 0 %
Eosinophils Absolute: 0.3 10*3/uL (ref 0.0–0.5)
Eosinophils Relative: 4 %
HCT: 44.7 % (ref 39.0–52.0)
Hemoglobin: 15.3 g/dL (ref 13.0–17.0)
Immature Granulocytes: 0 %
Lymphocytes Relative: 19 %
Lymphs Abs: 1.6 10*3/uL (ref 0.7–4.0)
MCH: 31.5 pg (ref 26.0–34.0)
MCHC: 34.2 g/dL (ref 30.0–36.0)
MCV: 92 fL (ref 80.0–100.0)
Monocytes Absolute: 0.7 10*3/uL (ref 0.1–1.0)
Monocytes Relative: 9 %
Neutro Abs: 5.8 10*3/uL (ref 1.7–7.7)
Neutrophils Relative %: 68 %
Platelets: 174 10*3/uL (ref 150–400)
RBC: 4.86 MIL/uL (ref 4.22–5.81)
RDW: 13.3 % (ref 11.5–15.5)
WBC: 8.6 10*3/uL (ref 4.0–10.5)
nRBC: 0 % (ref 0.0–0.2)

## 2020-04-05 LAB — BASIC METABOLIC PANEL
Anion gap: 9 (ref 5–15)
BUN: 15 mg/dL (ref 6–20)
CO2: 26 mmol/L (ref 22–32)
Calcium: 9.4 mg/dL (ref 8.9–10.3)
Chloride: 99 mmol/L (ref 98–111)
Creatinine, Ser: 1.43 mg/dL — ABNORMAL HIGH (ref 0.61–1.24)
GFR, Estimated: >60 mL/min (ref >60)
Glucose, Bld: 103 mg/dL — ABNORMAL HIGH (ref 70–99)
Potassium: 4.5 mmol/L (ref 3.5–5.1)
Sodium: 134 mmol/L — ABNORMAL LOW (ref 135–145)

## 2020-04-05 LAB — D-DIMER, QUANTITATIVE: D-Dimer, Quant: 19.93 ug/mL-FEU — ABNORMAL HIGH (ref 0.00–0.50)

## 2020-04-05 LAB — TROPONIN I (HIGH SENSITIVITY)
Troponin I (High Sensitivity): 42 ng/L — ABNORMAL HIGH (ref <18)
Troponin I (High Sensitivity): 42 ng/L — ABNORMAL HIGH (ref <18)

## 2020-04-05 MED ORDER — APIXABAN (ELIQUIS) VTE STARTER PACK (10MG AND 5MG): ORAL_TABLET | ORAL | 0 refills | Status: DC

## 2020-04-05 MED ORDER — HYDROXYZINE HCL 25 MG PO TABS
25.0000 mg | ORAL_TABLET | Freq: Once | ORAL | Status: AC
  Administered 2020-04-05: 25 mg via ORAL
  Filled 2020-04-05: qty 1

## 2020-04-05 MED ORDER — RIVAROXABAN (XARELTO) VTE STARTER PACK (15 & 20 MG): ORAL_TABLET | ORAL | 0 refills | Status: DC

## 2020-04-05 MED ORDER — RIVAROXABAN 15 MG PO TABS: 15.0000 mg | ORAL_TABLET | Freq: Once | ORAL | Status: DC

## 2020-04-05 MED ORDER — IOHEXOL 350 MG/ML SOLN
100.0000 mL | Freq: Once | INTRAVENOUS | Status: AC | PRN
  Administered 2020-04-05: 100 mL via INTRAVENOUS

## 2020-04-05 MED ORDER — APIXABAN 2.5 MG PO TABS
10.0000 mg | ORAL_TABLET | Freq: Once | ORAL | Status: AC
  Administered 2020-04-05: 10 mg via ORAL
  Filled 2020-04-05: qty 4

## 2020-04-05 MED ORDER — APIXABAN (ELIQUIS) EDUCATION KIT FOR DVT/PE PATIENTS: PACK | Freq: Once | Status: DC

## 2020-04-05 MED ORDER — VALACYCLOVIR HCL 500 MG PO TABS
1000.0000 mg | ORAL_TABLET | Freq: Once | ORAL | Status: AC
  Administered 2020-04-05: 1000 mg via ORAL
  Filled 2020-04-05: qty 2

## 2020-04-05 NOTE — ED Notes (Signed)
ED Provider at bedside. 

## 2020-04-05 NOTE — ED Notes (Signed)
Pt. Reports he wants treatment for his R eye that is red and wants treatment for his herpes before he is released.
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
## 2020-04-05 NOTE — ED Notes (Signed)
Pt. Is in no distress and has no reported chest pain while RN in room to assess the Pt.
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
## 2020-04-05 NOTE — Discharge Instructions (Addendum)
You were given a prescription for a blood thinning medication to help treat the blood clot in your lungs.  You will need to pick this up at the pharmacy.  I have reached out to our social worker to help get you plugged in with a primary care physician.  Have also given you information for a primary care clinic.  You can call the office to schedule an appointment for follow-up.  Please return to the emergency department for any new or worsening symptoms including chest pain, shortness of breath or other concerns.

## 2020-04-06 ENCOUNTER — Telehealth (HOSPITAL_BASED_OUTPATIENT_CLINIC_OR_DEPARTMENT_OTHER): Payer: Self-pay | Admitting: Emergency Medicine

## 2020-04-06 ENCOUNTER — Other Ambulatory Visit (HOSPITAL_BASED_OUTPATIENT_CLINIC_OR_DEPARTMENT_OTHER): Payer: Self-pay | Admitting: Emergency Medicine

## 2020-04-06 LAB — HIV ANTIBODY (ROUTINE TESTING W REFLEX): HIV Screen 4th Generation wRfx: NONREACTIVE

## 2020-04-06 LAB — RPR: RPR Ser Ql: NONREACTIVE

## 2020-04-06 MED FILL — valACYclovir HCL 1 GM TABS: 1 | 5 days supply | Qty: 5 | Fill #0

## 2020-04-06 MED FILL — ELIQUIS STARTER PACK 5 MG T: 5 | 30 days supply | Qty: 74 | Fill #0

## 2020-04-06 NOTE — Clinical Social Work Note (Signed)
CSW received call that pt was in need of PCP establisment and f/u after being seen. CSW reached out to [facility] and Wellness, pt has appointment for May 02, 2020 at 9:30am. CSW informed pt of appointment and provided pt with address to facility. CSW also alerted [facility] and Wellness that pt would benefit from speaking with social worker at appointment as he may need some medication assistance. TOC signing off.

## 2020-05-02 ENCOUNTER — Other Ambulatory Visit: Payer: Self-pay | Admitting: Nurse Practitioner

## 2020-05-02 ENCOUNTER — Other Ambulatory Visit: Payer: Self-pay

## 2020-05-02 ENCOUNTER — Encounter: Payer: Self-pay | Admitting: Nurse Practitioner

## 2020-05-02 ENCOUNTER — Ambulatory Visit: Payer: Self-pay | Attending: Nurse Practitioner | Admitting: Nurse Practitioner

## 2020-05-02 VITALS — Ht 73.0 in | Wt 230.0 lb

## 2020-05-02 DIAGNOSIS — Z1159 Encounter for screening for other viral diseases: Secondary | ICD-10-CM

## 2020-05-02 DIAGNOSIS — Z7689 Persons encountering health services in other specified circumstances: Secondary | ICD-10-CM

## 2020-05-02 DIAGNOSIS — G8929 Other chronic pain: Secondary | ICD-10-CM

## 2020-05-02 DIAGNOSIS — F419 Anxiety disorder, unspecified: Secondary | ICD-10-CM

## 2020-05-02 DIAGNOSIS — E871 Hypo-osmolality and hyponatremia: Secondary | ICD-10-CM

## 2020-05-02 DIAGNOSIS — F32A Depression, unspecified: Secondary | ICD-10-CM

## 2020-05-02 DIAGNOSIS — R7309 Other abnormal glucose: Secondary | ICD-10-CM

## 2020-05-02 DIAGNOSIS — M545 Low back pain, unspecified: Secondary | ICD-10-CM

## 2020-05-02 DIAGNOSIS — I2699 Other pulmonary embolism without acute cor pulmonale: Secondary | ICD-10-CM

## 2020-05-02 DIAGNOSIS — Z1211 Encounter for screening for malignant neoplasm of colon: Secondary | ICD-10-CM

## 2020-05-02 MED ORDER — GABAPENTIN 300 MG PO CAPS: 300.0000 mg | ORAL_CAPSULE | Freq: Three times a day (TID) | ORAL | 1 refills | Status: DC

## 2020-05-02 MED ORDER — APIXABAN 5 MG PO TABS: 5.0000 mg | ORAL_TABLET | Freq: Two times a day (BID) | ORAL | 2 refills | Status: DC

## 2020-05-02 MED ORDER — TRAZODONE HCL 100 MG PO TABS: 50.0000 mg | ORAL_TABLET | Freq: Every evening | ORAL | 3 refills | Status: DC | PRN

## 2020-05-02 MED ORDER — FLUOXETINE HCL 20 MG PO CAPS
20.0000 mg | ORAL_CAPSULE | Freq: Every day | ORAL | 0 refills | Status: DC
Start: 2020-05-02 — End: 2020-09-05

## 2020-05-02 MED FILL — FLUoxetine HCL 20 MG CAPS: 20 | 30 days supply | Qty: 30 | Fill #0

## 2020-05-02 MED FILL — GABAPENTIN 300 MG CAPSULE: 300 | 30 days supply | Qty: 90 | Fill #0

## 2020-05-02 MED FILL — TRAZODONE HCL 100 MG TABS: 100 | 30 days supply | Qty: 30 | Fill #0
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
## 2020-05-03 MED FILL — !ELIQUIS 5 MG TABLET: 5 | 30 days supply | Qty: 60 | Fill #0

## 2020-05-07 ENCOUNTER — Telehealth: Payer: Self-pay | Admitting: Nurse Practitioner

## 2020-05-07 ENCOUNTER — Other Ambulatory Visit: Payer: Self-pay | Admitting: Nurse Practitioner

## 2020-05-07 MED ORDER — VALACYCLOVIR HCL 1 G PO TABS: 1000.0000 mg | ORAL_TABLET | Freq: Every day | ORAL | 0 refills | Status: AC

## 2020-05-07 NOTE — Telephone Encounter (Signed)
SENT TO PHARMACY 

## 2020-05-07 NOTE — Telephone Encounter (Signed)
Patient came by to see if Zelda would send in a prescription for Valtrex. Please advise.

## 2020-05-08 MED FILL — valACYclovir HCL 1 GM TABS: 1 | 5 days supply | Qty: 5 | Fill #0

## 2020-05-15 ENCOUNTER — Ambulatory Visit: Payer: Self-pay | Attending: Nurse Practitioner

## 2020-05-15 ENCOUNTER — Other Ambulatory Visit: Payer: Self-pay

## 2020-05-15 DIAGNOSIS — R7309 Other abnormal glucose: Secondary | ICD-10-CM

## 2020-05-15 DIAGNOSIS — Z1159 Encounter for screening for other viral diseases: Secondary | ICD-10-CM

## 2020-05-15 DIAGNOSIS — E871 Hypo-osmolality and hyponatremia: Secondary | ICD-10-CM

## 2020-05-15 MED FILL — valACYclovir HCL 1 GM TABS: 1 | 5 days supply | Qty: 5 | Fill #0

## 2020-05-16 ENCOUNTER — Other Ambulatory Visit: Payer: Self-pay

## 2020-05-16 LAB — CMP14+EGFR
ALT: 20 IU/L (ref 0–44)
AST: 23 IU/L (ref 0–40)
Albumin/Globulin Ratio: 1.7 (ref 1.2–2.2)
Albumin: 4.3 g/dL (ref 4.0–5.0)
Alkaline Phosphatase: 68 IU/L (ref 44–121)
BUN/Creatinine Ratio: 7 — ABNORMAL LOW (ref 9–20)
BUN: 9 mg/dL (ref 6–24)
Bilirubin Total: <0.2 mg/dL (ref 0.0–1.2)
CO2: 26 mmol/L (ref 20–29)
Calcium: 9.6 mg/dL (ref 8.7–10.2)
Chloride: 100 mmol/L (ref 96–106)
Creatinine, Ser: 1.3 mg/dL — ABNORMAL HIGH (ref 0.76–1.27)
Globulin, Total: 2.6 g/dL (ref 1.5–4.5)
Glucose: 107 mg/dL — ABNORMAL HIGH (ref 65–99)
Potassium: 4.9 mmol/L (ref 3.5–5.2)
Sodium: 140 mmol/L (ref 134–144)
Total Protein: 6.9 g/dL (ref 6.0–8.5)
eGFR: 68 mL/min/{1.73_m2} (ref >59)

## 2020-05-16 LAB — HEMOGLOBIN A1C
Est. average glucose Bld gHb Est-mCnc: 123 mg/dL
Hgb A1c MFr Bld: 5.9 % — ABNORMAL HIGH (ref 4.8–5.6)

## 2020-05-16 LAB — HCV INTERPRETATION

## 2020-05-16 LAB — HCV AB W REFLEX TO QUANT PCR: HCV Ab: <0.1 s/co ratio (ref 0.0–0.9)

## 2020-05-23 ENCOUNTER — Other Ambulatory Visit: Payer: Self-pay

## 2020-05-23 ENCOUNTER — Ambulatory Visit: Payer: Self-pay | Attending: Nurse Practitioner

## 2020-06-27 ENCOUNTER — Ambulatory Visit: Payer: Self-pay | Admitting: Nurse Practitioner

## 2020-07-04 ENCOUNTER — Telehealth: Payer: Self-pay

## 2020-07-04 NOTE — Telephone Encounter (Signed)
Patient called in requesting medication refills and a change in medication, if possible.

## 2020-07-04 NOTE — Telephone Encounter (Signed)
Will forward to provider  

## 2020-07-05 ENCOUNTER — Other Ambulatory Visit: Payer: Self-pay | Admitting: Nurse Practitioner

## 2020-07-05 MED ORDER — VALACYCLOVIR HCL 1 G PO TABS
ORAL_TABLET | ORAL | 1 refills | Status: DC
  Filled 2020-07-05 – 2020-07-16 (×2): qty 5, 5d supply, fill #0

## 2020-07-05 NOTE — Telephone Encounter (Signed)
I do not prescribe any other sleep aids. May want to increase his water intake or he can try a lower dose breaking the trazodone in half and taking with melatonin over the counter. Valacyclovir has been sent

## 2020-07-06 ENCOUNTER — Other Ambulatory Visit: Payer: Self-pay

## 2020-07-06 MED FILL — Apixaban Tab 5 MG: ORAL | 30 days supply | Qty: 60 | Fill #0 | Status: CN

## 2020-07-06 NOTE — Telephone Encounter (Signed)
Returned pt call and left a detailed vm making pt aware of provider response and if he has any questions or concerns to give [place] a call

## 2020-07-13 ENCOUNTER — Other Ambulatory Visit: Payer: Self-pay

## 2020-07-16 ENCOUNTER — Other Ambulatory Visit: Payer: Self-pay

## 2020-07-16 ENCOUNTER — Other Ambulatory Visit: Payer: Self-pay | Admitting: Nurse Practitioner

## 2020-07-16 MED ORDER — APIXABAN 5 MG PO TABS
1.0000 | ORAL_TABLET | Freq: Two times a day (BID) | ORAL | 2 refills | Status: DC
  Filled 2020-07-16: qty 60, 30d supply, fill #0
  Filled 2020-08-17: qty 120, 60d supply, fill #1

## 2020-07-16 MED FILL — Apixaban Tab 5 MG: ORAL | 30 days supply | Qty: 60 | Fill #0 | Status: CN

## 2020-07-16 NOTE — Telephone Encounter (Signed)
Medication Refill - Medication: apixaban (ELIQUIS) 5 MG TABS tablet  Patient stated he is all out of medication    Preferred Pharmacy (with phone number or street name):  [facility] Phone:  [phone]  Fax:  [phone]       Agent: Please be advised that RX refills may take up to 3 business days. We ask that you follow-up with your pharmacy.

## 2020-08-09 ENCOUNTER — Other Ambulatory Visit (HOSPITAL_COMMUNITY): Payer: Self-pay

## 2020-08-14 ENCOUNTER — Other Ambulatory Visit: Payer: Self-pay | Admitting: Nurse Practitioner

## 2020-08-14 NOTE — Telephone Encounter (Signed)
Medication Refill - Medication: apixaban (ELIQUIS) 5 MG TABS tablet     Preferred Pharmacy (with phone number or street name): [facility]  Agent: Please be advised that RX refills may take up to 3 business days. We ask that you follow-up with your pharmacy.  

## 2020-08-17 ENCOUNTER — Other Ambulatory Visit: Payer: Self-pay

## 2020-08-21 ENCOUNTER — Ambulatory Visit (HOSPITAL_COMMUNITY)
Admission: RE | Admit: 2020-08-21 | Discharge: 2020-08-21 | Disposition: A | Discharge status: Home / Self Care | Payer: Self-pay | Source: Ambulatory Visit | Attending: Nurse Practitioner | Admitting: Nurse Practitioner

## 2020-08-21 ENCOUNTER — Other Ambulatory Visit: Payer: Self-pay

## 2020-08-21 DIAGNOSIS — Z8616 Personal history of COVID-19: Secondary | ICD-10-CM | POA: Insufficient documentation

## 2020-08-21 DIAGNOSIS — I2699 Other pulmonary embolism without acute cor pulmonale: Secondary | ICD-10-CM | POA: Insufficient documentation

## 2020-08-21 DIAGNOSIS — I340 Nonrheumatic mitral (valve) insufficiency: Secondary | ICD-10-CM | POA: Insufficient documentation

## 2020-08-21 LAB — ECHOCARDIOGRAM COMPLETE
AR max vel: 3.09 cm2
AV Area VTI: 3.04 cm2
AV Area mean vel: 3.12 cm2
AV Mean grad: 3 mmHg
AV Peak grad: 5.9 mmHg
Ao pk vel: 1.21 m/s
Area-P 1/2: 3.37 cm2
S' Lateral: 3.4 cm

## 2020-08-21 NOTE — Progress Notes (Signed)
  Echocardiogram 2D Echocardiogram has been performed.  Roger Hurley F 08/21/2020, 10:54 AM

## 2020-08-24 ENCOUNTER — Other Ambulatory Visit: Payer: Self-pay

## 2020-09-05 ENCOUNTER — Ambulatory Visit: Payer: Self-pay | Attending: Nurse Practitioner | Admitting: Nurse Practitioner

## 2020-09-05 ENCOUNTER — Other Ambulatory Visit: Payer: Self-pay

## 2020-09-05 ENCOUNTER — Encounter: Payer: Self-pay | Admitting: Nurse Practitioner

## 2020-09-05 DIAGNOSIS — Z1211 Encounter for screening for malignant neoplasm of colon: Secondary | ICD-10-CM

## 2020-09-05 DIAGNOSIS — R21 Rash and other nonspecific skin eruption: Secondary | ICD-10-CM

## 2020-09-05 DIAGNOSIS — M545 Low back pain, unspecified: Secondary | ICD-10-CM

## 2020-09-05 DIAGNOSIS — B009 Herpesviral infection, unspecified: Secondary | ICD-10-CM

## 2020-09-05 DIAGNOSIS — G8929 Other chronic pain: Secondary | ICD-10-CM

## 2020-09-05 DIAGNOSIS — F418 Other specified anxiety disorders: Secondary | ICD-10-CM

## 2020-09-05 DIAGNOSIS — F5105 Insomnia due to other mental disorder: Secondary | ICD-10-CM

## 2020-09-05 DIAGNOSIS — Z86711 Personal history of pulmonary embolism: Secondary | ICD-10-CM

## 2020-09-05 MED ORDER — ZOLPIDEM TARTRATE 5 MG PO TABS
5.0000 mg | ORAL_TABLET | Freq: Every evening | ORAL | 0 refills | Status: DC | PRN
  Filled 2020-09-05: qty 30, 30d supply, fill #0

## 2020-09-05 MED ORDER — VALACYCLOVIR HCL 1 G PO TABS
ORAL_TABLET | ORAL | 3 refills | Status: DC
  Filled 2020-09-05: qty 5, 5d supply, fill #0

## 2020-09-05 MED ORDER — HYDROCORTISONE 2.5 % EX CREA
TOPICAL_CREAM | Freq: Two times a day (BID) | CUTANEOUS | 2 refills | Status: DC
Start: 2020-09-05 — End: 2021-01-03
  Filled 2020-09-05: qty 60, 15d supply, fill #0
  Filled 2020-10-10: qty 60, 15d supply, fill #1
  Filled 2021-01-03: qty 60, 15d supply, fill #2

## 2020-09-05 MED ORDER — FLUOXETINE HCL 20 MG PO CAPS
ORAL_CAPSULE | Freq: Every day | ORAL | 0 refills | Status: DC
  Filled 2020-09-05: qty 30, 30d supply, fill #0

## 2020-09-05 MED ORDER — APIXABAN 5 MG PO TABS
1.0000 | ORAL_TABLET | Freq: Two times a day (BID) | ORAL | 2 refills | Status: DC
  Filled 2020-09-05: qty 60, 30d supply, fill #0
  Filled 2020-10-12: qty 180, 90d supply, fill #0
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
## 2020-09-07 ENCOUNTER — Other Ambulatory Visit: Payer: Self-pay

## 2020-09-12 ENCOUNTER — Other Ambulatory Visit: Payer: Self-pay

## 2020-09-12 ENCOUNTER — Other Ambulatory Visit: Payer: Self-pay | Admitting: Nurse Practitioner

## 2020-09-12 DIAGNOSIS — F5105 Insomnia due to other mental disorder: Secondary | ICD-10-CM

## 2020-09-12 MED ORDER — ZOLPIDEM TARTRATE 5 MG PO TABS: 5.0000 mg | ORAL_TABLET | Freq: Every evening | ORAL | 0 refills | Status: DC | PRN

## 2020-09-22 ENCOUNTER — Emergency Department (HOSPITAL_BASED_OUTPATIENT_CLINIC_OR_DEPARTMENT_OTHER)
Admission: EM | Admit: 2020-09-22 | Discharge: 2020-09-22 | Disposition: A | Discharge status: Home / Self Care | Payer: Self-pay | Attending: Emergency Medicine | Admitting: Emergency Medicine

## 2020-09-22 ENCOUNTER — Other Ambulatory Visit: Payer: Self-pay

## 2020-09-22 ENCOUNTER — Encounter (HOSPITAL_BASED_OUTPATIENT_CLINIC_OR_DEPARTMENT_OTHER): Payer: Self-pay

## 2020-09-22 ENCOUNTER — Emergency Department (HOSPITAL_BASED_OUTPATIENT_CLINIC_OR_DEPARTMENT_OTHER): Payer: Self-pay

## 2020-09-22 DIAGNOSIS — Z20822 Contact with and (suspected) exposure to covid-19: Secondary | ICD-10-CM | POA: Insufficient documentation

## 2020-09-22 DIAGNOSIS — R6883 Chills (without fever): Secondary | ICD-10-CM | POA: Insufficient documentation

## 2020-09-22 DIAGNOSIS — R52 Pain, unspecified: Secondary | ICD-10-CM | POA: Insufficient documentation

## 2020-09-22 DIAGNOSIS — F17210 Nicotine dependence, cigarettes, uncomplicated: Secondary | ICD-10-CM | POA: Insufficient documentation

## 2020-09-22 DIAGNOSIS — R112 Nausea with vomiting, unspecified: Secondary | ICD-10-CM | POA: Insufficient documentation

## 2020-09-22 DIAGNOSIS — R531 Weakness: Secondary | ICD-10-CM | POA: Insufficient documentation

## 2020-09-22 DIAGNOSIS — R5383 Other fatigue: Secondary | ICD-10-CM | POA: Insufficient documentation

## 2020-09-22 DIAGNOSIS — R0602 Shortness of breath: Secondary | ICD-10-CM | POA: Insufficient documentation

## 2020-09-22 DIAGNOSIS — Z7901 Long term (current) use of anticoagulants: Secondary | ICD-10-CM | POA: Insufficient documentation

## 2020-09-22 LAB — CBC WITH DIFFERENTIAL/PLATELET
Abs Immature Granulocytes: 0.01 10*3/uL (ref 0.00–0.07)
Basophils Absolute: 0 10*3/uL (ref 0.0–0.1)
Basophils Relative: 0 %
Eosinophils Absolute: 0.2 10*3/uL (ref 0.0–0.5)
Eosinophils Relative: 2 %
HCT: 44.6 % (ref 39.0–52.0)
Hemoglobin: 15.4 g/dL (ref 13.0–17.0)
Immature Granulocytes: 0 %
Lymphocytes Relative: 34 %
Lymphs Abs: 2.9 10*3/uL (ref 0.7–4.0)
MCH: 31.2 pg (ref 26.0–34.0)
MCHC: 34.5 g/dL (ref 30.0–36.0)
MCV: 90.5 fL (ref 80.0–100.0)
Monocytes Absolute: 0.8 10*3/uL (ref 0.1–1.0)
Monocytes Relative: 9 %
Neutro Abs: 4.5 10*3/uL (ref 1.7–7.7)
Neutrophils Relative %: 55 %
Platelets: 250 10*3/uL (ref 150–400)
RBC: 4.93 MIL/uL (ref 4.22–5.81)
RDW: 13.4 % (ref 11.5–15.5)
WBC: 8.3 10*3/uL (ref 4.0–10.5)
nRBC: 0 % (ref 0.0–0.2)

## 2020-09-22 LAB — COMPREHENSIVE METABOLIC PANEL
ALT: 25 U/L (ref 0–44)
AST: 22 U/L (ref 15–41)
Albumin: 4 g/dL (ref 3.5–5.0)
Alkaline Phosphatase: 52 U/L (ref 38–126)
Anion gap: 7 (ref 5–15)
BUN: 11 mg/dL (ref 6–20)
CO2: 29 mmol/L (ref 22–32)
Calcium: 9.5 mg/dL (ref 8.9–10.3)
Chloride: 102 mmol/L (ref 98–111)
Creatinine, Ser: 1.22 mg/dL (ref 0.61–1.24)
GFR, Estimated: >60 mL/min (ref >60)
Glucose, Bld: 77 mg/dL (ref 70–99)
Potassium: 3.6 mmol/L (ref 3.5–5.1)
Sodium: 138 mmol/L (ref 135–145)
Total Bilirubin: 0.5 mg/dL (ref 0.3–1.2)
Total Protein: 7.4 g/dL (ref 6.5–8.1)

## 2020-09-22 LAB — RESP PANEL BY RT-PCR (FLU A&B, COVID) ARPGX2
Influenza A by PCR: NEGATIVE
Influenza B by PCR: NEGATIVE
SARS Coronavirus 2 by RT PCR: NEGATIVE

## 2020-09-22 LAB — TROPONIN I (HIGH SENSITIVITY): Troponin I (High Sensitivity): 5 ng/L (ref <18)

## 2020-09-22 LAB — LIPASE, BLOOD: Lipase: 55 U/L — ABNORMAL HIGH (ref 11–51)

## 2020-09-22 MED ORDER — ONDANSETRON 4 MG PO TBDP: 4.0000 mg | ORAL_TABLET | Freq: Three times a day (TID) | ORAL | 0 refills | Status: DC | PRN

## 2020-09-22 MED ORDER — IOHEXOL 350 MG/ML SOLN
100.0000 mL | Freq: Once | INTRAVENOUS | Status: AC | PRN
  Administered 2020-09-22: 100 mL via INTRAVENOUS

## 2020-09-22 NOTE — Discharge Instructions (Addendum)
It was a pleasure taking care of you today.  As discussed, all of your labs were reassuring today.  Your COVID and influenza test were negative.  Your CT of your chest did not show any blood clots.  Your chest x-ray was normal.  I suspect your symptoms are related to a viral infection.  Continue to take over-the-counter ibuprofen or Tylenol as needed for body aches.  I am sending you home with nausea medication.  Take as needed.  Please follow-up with PCP if symptoms do not improved within the next week.  Return to the ER for any worsening symptoms.

## 2020-09-22 NOTE — ED Triage Notes (Signed)
Pt c/o SOB, weakness, chills for the past week. Had vomiting, diarrhea earlier this week, able to tolerate PO now. States he has pulmonary embolisms, taking eliquis. Denies chest pain.

## 2020-09-22 NOTE — ED Notes (Signed)
Cancel repeat troponin per [facility]
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
## 2020-09-22 NOTE — ED Notes (Signed)
Pt. Provided meal 

## 2020-09-26 ENCOUNTER — Encounter: Payer: Self-pay | Admitting: Physical Medicine & Rehabilitation

## 2020-10-10 ENCOUNTER — Other Ambulatory Visit: Payer: Self-pay

## 2020-10-11 ENCOUNTER — Other Ambulatory Visit: Payer: Self-pay

## 2020-10-12 ENCOUNTER — Other Ambulatory Visit: Payer: Self-pay

## 2020-10-23 ENCOUNTER — Other Ambulatory Visit: Payer: Self-pay

## 2020-10-23 ENCOUNTER — Ambulatory Visit: Payer: Self-pay | Attending: Nurse Practitioner | Admitting: Nurse Practitioner

## 2020-10-23 ENCOUNTER — Encounter: Payer: Self-pay | Admitting: Nurse Practitioner

## 2020-10-23 DIAGNOSIS — F418 Other specified anxiety disorders: Secondary | ICD-10-CM

## 2020-10-23 DIAGNOSIS — F5105 Insomnia due to other mental disorder: Secondary | ICD-10-CM

## 2020-10-23 DIAGNOSIS — G8929 Other chronic pain: Secondary | ICD-10-CM

## 2020-10-23 DIAGNOSIS — M545 Low back pain, unspecified: Secondary | ICD-10-CM

## 2020-10-23 DIAGNOSIS — Z09 Encounter for follow-up examination after completed treatment for conditions other than malignant neoplasm: Secondary | ICD-10-CM

## 2020-10-23 DIAGNOSIS — B009 Herpesviral infection, unspecified: Secondary | ICD-10-CM

## 2020-10-23 DIAGNOSIS — Z1211 Encounter for screening for malignant neoplasm of colon: Secondary | ICD-10-CM

## 2020-10-23 MED ORDER — FLUOXETINE HCL 20 MG PO CAPS
ORAL_CAPSULE | Freq: Every day | ORAL | 0 refills | Status: DC
  Filled 2020-10-23 – 2021-01-03 (×2): qty 30, 30d supply, fill #0

## 2020-10-23 MED ORDER — GABAPENTIN 300 MG PO CAPS
ORAL_CAPSULE | ORAL | 1 refills | Status: DC
  Filled 2020-10-23 – 2021-01-03 (×2): qty 90, 30d supply, fill #0

## 2020-10-23 MED ORDER — VALACYCLOVIR HCL 1 G PO TABS
ORAL_TABLET | ORAL | 3 refills | Status: DC
  Filled 2020-10-23 – 2021-02-01 (×2): qty 5, 5d supply, fill #0
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
## 2020-10-30 ENCOUNTER — Other Ambulatory Visit: Payer: Self-pay

## 2020-11-29 ENCOUNTER — Encounter: Payer: Self-pay | Admitting: Physical Medicine & Rehabilitation

## 2020-12-10 ENCOUNTER — Ambulatory Visit: Payer: Self-pay | Admitting: Nurse Practitioner

## 2020-12-31 ENCOUNTER — Encounter (HOSPITAL_BASED_OUTPATIENT_CLINIC_OR_DEPARTMENT_OTHER): Payer: Self-pay | Admitting: Emergency Medicine

## 2020-12-31 ENCOUNTER — Other Ambulatory Visit: Payer: Self-pay

## 2020-12-31 DIAGNOSIS — Z5321 Procedure and treatment not carried out due to patient leaving prior to being seen by health care provider: Secondary | ICD-10-CM | POA: Insufficient documentation

## 2020-12-31 DIAGNOSIS — M25512 Pain in left shoulder: Secondary | ICD-10-CM | POA: Insufficient documentation

## 2020-12-31 DIAGNOSIS — M25572 Pain in left ankle and joints of left foot: Secondary | ICD-10-CM | POA: Insufficient documentation

## 2020-12-31 DIAGNOSIS — M545 Low back pain, unspecified: Secondary | ICD-10-CM | POA: Insufficient documentation

## 2020-12-31 DIAGNOSIS — Y92410 Unspecified street and highway as the place of occurrence of the external cause: Secondary | ICD-10-CM | POA: Insufficient documentation
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
## 2021-01-01 ENCOUNTER — Emergency Department (HOSPITAL_BASED_OUTPATIENT_CLINIC_OR_DEPARTMENT_OTHER): Payer: Self-pay

## 2021-01-01 ENCOUNTER — Emergency Department (HOSPITAL_BASED_OUTPATIENT_CLINIC_OR_DEPARTMENT_OTHER)
Admission: EM | Admit: 2021-01-01 | Discharge: 2021-01-01 | Disposition: A | Discharge status: Home / Self Care | Payer: Self-pay | Attending: Emergency Medicine | Admitting: Emergency Medicine

## 2021-01-01 DIAGNOSIS — R52 Pain, unspecified: Secondary | ICD-10-CM

## 2021-01-03 ENCOUNTER — Other Ambulatory Visit: Payer: Self-pay | Admitting: Nurse Practitioner

## 2021-01-03 ENCOUNTER — Other Ambulatory Visit: Payer: Self-pay

## 2021-01-03 DIAGNOSIS — G8929 Other chronic pain: Secondary | ICD-10-CM

## 2021-01-03 DIAGNOSIS — R21 Rash and other nonspecific skin eruption: Secondary | ICD-10-CM

## 2021-01-03 DIAGNOSIS — Z86711 Personal history of pulmonary embolism: Secondary | ICD-10-CM

## 2021-01-03 DIAGNOSIS — F5105 Insomnia due to other mental disorder: Secondary | ICD-10-CM

## 2021-01-03 DIAGNOSIS — M545 Low back pain, unspecified: Secondary | ICD-10-CM

## 2021-01-03 DIAGNOSIS — F418 Other specified anxiety disorders: Secondary | ICD-10-CM

## 2021-01-03 NOTE — Telephone Encounter (Signed)
Patient is out of these meds, please send short supply. Medication Refill - Medication:gabapentin (NEURONTIN) 300 MG capsule  /FLUoxetine (PROZAC) 20 MG capsule / hydrocortisone 2.5 % cream /apixaban (ELIQUIS) 5 MG TABS tablet   Has the patient contacted their pharmacy? yes (Agent: If no, request that the patient contact the pharmacy for the refill.) (Agent: If yes, when and what did the pharmacy advise?)no answer when called  Preferred Pharmacy (with phone number or street name):  [facility] Phone:  [phone]  Fax:  [phone]     Has the patient been seen for an appointment in the last year OR does the patient have an upcoming appointment? yes  Agent: Please be advised that RX refills may take up to 3 business days. We ask that you follow-up with your pharmacy.

## 2021-01-04 ENCOUNTER — Encounter (HOSPITAL_BASED_OUTPATIENT_CLINIC_OR_DEPARTMENT_OTHER): Payer: Self-pay | Admitting: Emergency Medicine

## 2021-01-04 ENCOUNTER — Emergency Department (HOSPITAL_BASED_OUTPATIENT_CLINIC_OR_DEPARTMENT_OTHER)
Admission: EM | Admit: 2021-01-04 | Discharge: 2021-01-04 | Disposition: A | Discharge status: Home / Self Care | Payer: Self-pay | Attending: Emergency Medicine | Admitting: Emergency Medicine

## 2021-01-04 ENCOUNTER — Other Ambulatory Visit: Payer: Self-pay

## 2021-01-04 ENCOUNTER — Emergency Department (HOSPITAL_BASED_OUTPATIENT_CLINIC_OR_DEPARTMENT_OTHER): Payer: Self-pay

## 2021-01-04 DIAGNOSIS — Z7901 Long term (current) use of anticoagulants: Secondary | ICD-10-CM | POA: Insufficient documentation

## 2021-01-04 DIAGNOSIS — M25512 Pain in left shoulder: Secondary | ICD-10-CM

## 2021-01-04 DIAGNOSIS — F17210 Nicotine dependence, cigarettes, uncomplicated: Secondary | ICD-10-CM | POA: Insufficient documentation

## 2021-01-04 DIAGNOSIS — M25572 Pain in left ankle and joints of left foot: Secondary | ICD-10-CM | POA: Insufficient documentation

## 2021-01-04 DIAGNOSIS — Y92410 Unspecified street and highway as the place of occurrence of the external cause: Secondary | ICD-10-CM | POA: Insufficient documentation

## 2021-01-04 MED ORDER — DICLOFENAC SODIUM ER 100 MG PO TB24
100.0000 mg | ORAL_TABLET | Freq: Every day | ORAL | 0 refills | Status: DC
  Filled 2021-01-04: qty 5, 5d supply, fill #0

## 2021-01-04 MED ORDER — LIDOCAINE 5 % EX PTCH
1.0000 | MEDICATED_PATCH | CUTANEOUS | 0 refills | Status: AC
  Filled 2021-01-04: qty 30, 30d supply, fill #0

## 2021-01-04 MED ORDER — KETOROLAC TROMETHAMINE 60 MG/2ML IM SOLN
60.0000 mg | Freq: Once | INTRAMUSCULAR | Status: AC
  Administered 2021-01-04: 60 mg via INTRAMUSCULAR
  Filled 2021-01-04: qty 2

## 2021-01-04 MED ORDER — GABAPENTIN 300 MG PO CAPS
ORAL_CAPSULE | ORAL | 0 refills | Status: DC
  Filled 2021-01-04: qty 90, fill #0

## 2021-01-04 MED ORDER — LIDOCAINE 5 % EX PTCH
2.0000 | MEDICATED_PATCH | CUTANEOUS | Status: DC
  Administered 2021-01-04: 2 via TRANSDERMAL
  Filled 2021-01-04: qty 2

## 2021-01-04 MED ORDER — APIXABAN 5 MG PO TABS
5.0000 mg | ORAL_TABLET | Freq: Two times a day (BID) | ORAL | 0 refills | Status: DC
  Filled 2021-01-04: qty 60, 30d supply, fill #0

## 2021-01-04 MED ORDER — FLUOXETINE HCL 20 MG PO CAPS
ORAL_CAPSULE | Freq: Every day | ORAL | 0 refills | Status: DC
  Filled 2021-01-04: qty 30, fill #0

## 2021-01-04 MED ORDER — HYDROCORTISONE 2.5 % EX CREA
TOPICAL_CREAM | Freq: Two times a day (BID) | CUTANEOUS | 2 refills | Status: DC
  Filled 2021-01-04: qty 60, fill #0
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
## 2021-01-04 NOTE — Telephone Encounter (Signed)
Requested Prescriptions  Pending Prescriptions Disp Refills  . apixaban (ELIQUIS) 5 MG TABS tablet 60 tablet 0    Sig: TAKE 1 TABLET (5 MG TOTAL) BY MOUTH 2 (TWO) TIMES DAILY.     Hematology:  Anticoagulants Passed - 01/03/2021  2:56 PM      Passed - HGB in normal range and within 360 days    Hemoglobin  Date Value Ref Range Status  09/22/2020 15.4 13.0 - 17.0 g/dL Final         Passed - PLT in normal range and within 360 days    Platelets  Date Value Ref Range Status  09/22/2020 250 150 - 400 K/uL Final         Passed - HCT in normal range and within 360 days    HCT  Date Value Ref Range Status  09/22/2020 44.6 39.0 - 52.0 % Final         Passed - Cr in normal range and within 360 days    Creatinine, Ser  Date Value Ref Range Status  09/22/2020 1.22 0.61 - 1.24 mg/dL Final         Passed - Valid encounter within last 12 months    Recent Outpatient Visits          2 months ago Hospital discharge follow-up   [facility] [place], [place] W, NP   4 months ago Chronic bilateral low back pain without sciatica   [facility] [place], Shea Stakes, NP   8 months ago Encounter to establish care   [facility] [place], Shea Stakes, NP      Future Appointments            In 2 weeks Kirsteins, Victorino Sparrow, MD [facility] Physical Medicine and Rehabilitation, CPR           . FLUoxetine (PROZAC) 20 MG capsule 30 capsule 0    Sig: TAKE 1 CAPSULE (20 MG TOTAL) BY MOUTH DAILY.     Psychiatry:  Antidepressants - SSRI Passed - 01/03/2021  2:56 PM      Passed - Valid encounter within last 6 months    Recent Outpatient Visits          2 months ago Hospital discharge follow-up   [facility] [place], [place] W, NP   4 months ago Chronic bilateral low back pain without sciatica   [facility]ness Claiborne Rigg, NP   8 months ago Encounter to  establish care   [facility] [place], Shea Stakes, NP      Future Appointments            In 2 weeks Kirsteins, Victorino Sparrow, MD [facility] Physical Medicine and Rehabilitation, CPR           . hydrocortisone 2.5 % cream 60 g 2    Sig: Apply topically 2 (two) times daily.     Off-Protocol Failed - 01/03/2021  2:56 PM      Failed - Medication not assigned to a protocol, review manually.      Passed - Valid encounter within last 12 months    Recent Outpatient Visits          2 months ago Hospital discharge follow-up   [facility] [place], [place] W, NP   4 months ago Chronic bilateral low back pain without sciatica  [facility] Claiborne Rigg, NP   8 months ago Encounter to establish care   [facility] And [facility] [place], Shea Stakes, NP      Future Appointments            In 2 weeks Kirsteins, Victorino Sparrow, MD [facility] Physical Medicine and Rehabilitation, CPR           . gabapentin (NEURONTIN) 300 MG capsule 90 capsule 0    Sig: TAKE 1 CAPSULE (300 MG TOTAL) BY MOUTH 3 (THREE) TIMES DAILY. BACK PAIN     Neurology: Anticonvulsants - gabapentin Passed - 01/03/2021  2:56 PM      Passed - Valid encounter within last 12 months    Recent Outpatient Visits          2 months ago Hospital discharge follow-up   [facility] [place], [place] W, NP   4 months ago Chronic bilateral low back pain without sciatica   [facility] And Wellness Claiborne Rigg, NP   8 months ago Encounter to establish care   [facility] And Wellness Claiborne Rigg, NP      Future Appointments            In 2 weeks Kirsteins, Victorino Sparrow, MD [facility] Physical Medicine and Rehabilitation, CPR

## 2021-01-04 NOTE — Telephone Encounter (Signed)
Requested medications are due for refill today yes  Requested medications are on the active medication list yes  Last refill yes  Last visit 10/23/20  Future visit scheduled No, is supposed to return in Nov. No upcoming appt scheduled.  Notes to clinic This med does not have a protocol to follow, please assess.  Requested Prescriptions  Pending Prescriptions Disp Refills   hydrocortisone 2.5 % cream 60 g 2    Sig: Apply topically 2 (two) times daily.     Off-Protocol Failed - 01/03/2021  2:56 PM      Failed - Medication not assigned to a protocol, review manually.      Passed - Valid encounter within last 12 months    Recent Outpatient Visits           2 months ago Hospital discharge follow-up   [facility] And Wellness [place], [place] W, NP   4 months ago Chronic bilateral low back pain without sciatica   [facility] And Wellness Claiborne Rigg, NP   8 months ago Encounter to establish care   [facility] [place], Shea Stakes, NP       Future Appointments             In 2 weeks Kirsteins, Victorino Sparrow, MD [facility] Physical Medicine and Rehabilitation, CPR            Signed Prescriptions Disp Refills   apixaban (ELIQUIS) 5 MG TABS tablet 60 tablet 0    Sig: TAKE 1 TABLET (5 MG TOTAL) BY MOUTH 2 (TWO) TIMES DAILY.     Hematology:  Anticoagulants Passed - 01/03/2021  2:56 PM      Passed - HGB in normal range and within 360 days    Hemoglobin  Date Value Ref Range Status  09/22/2020 15.4 13.0 - 17.0 g/dL Final          Passed - PLT in normal range and within 360 days    Platelets  Date Value Ref Range Status  09/22/2020 250 150 - 400 K/uL Final          Passed - HCT in normal range and within 360 days    HCT  Date Value Ref Range Status  09/22/2020 44.6 39.0 - 52.0 % Final          Passed - Cr in normal range and within 360 days    Creatinine, Ser  Date Value Ref Range Status  09/22/2020  1.22 0.61 - 1.24 mg/dL Final          Passed - Valid encounter within last 12 months    Recent Outpatient Visits           2 months ago Hospital discharge follow-up   [facility] [place], [place] W, NP   4 months ago Chronic bilateral low back pain without sciatica   [facility] [place], Shea Stakes, NP   8 months ago Encounter to establish care   [facility] [place], Shea Stakes, NP       Future Appointments             In 2 weeks Kirsteins, Victorino Sparrow, MD [facility] Physical Medicine and Rehabilitation, CPR             FLUoxetine (PROZAC) 20 MG capsule 30 capsule 0    Sig: TAKE 1 CAPSULE (20 MG TOTAL) BY MOUTH DAILY.  Psychiatry:  Antidepressants - SSRI Passed - 01/03/2021  2:56 PM      Passed - Valid encounter within last 6 months    Recent Outpatient Visits           2 months ago Hospital discharge follow-up   [facility] And Wellness [place], [place] W, NP   4 months ago Chronic bilateral low back pain without sciatica   [facility] And Wellness Claiborne Rigg, NP   8 months ago Encounter to establish care   [facility] [place], Shea Stakes, NP       Future Appointments             In 2 weeks Kirsteins, Victorino Sparrow, MD [facility] Physical Medicine and Rehabilitation, CPR             gabapentin (NEURONTIN) 300 MG capsule 90 capsule 0    Sig: TAKE 1 CAPSULE (300 MG TOTAL) BY MOUTH 3 (THREE) TIMES DAILY. BACK PAIN     Neurology: Anticonvulsants - gabapentin Passed - 01/03/2021  2:56 PM      Passed - Valid encounter within last 12 months    Recent Outpatient Visits           2 months ago Hospital discharge follow-up   [facility] [place], [place] W, NP   4 months ago Chronic bilateral low back pain without sciatica   [facility] And Wellness Claiborne Rigg, NP   8  months ago Encounter to establish care   [facility] And Wellness Claiborne Rigg, NP       Future Appointments             In 2 weeks Kirsteins, Victorino Sparrow, MD [facility] Physical Medicine and Rehabilitation, CPR
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
## 2021-01-18 ENCOUNTER — Encounter: Payer: Self-pay | Admitting: Physical Medicine & Rehabilitation

## 2021-01-21 ENCOUNTER — Ambulatory Visit: Payer: Self-pay | Attending: Internal Medicine

## 2021-01-21 DIAGNOSIS — Z23 Encounter for immunization: Secondary | ICD-10-CM

## 2021-01-29 ENCOUNTER — Other Ambulatory Visit: Payer: Self-pay | Admitting: Nurse Practitioner

## 2021-01-29 ENCOUNTER — Other Ambulatory Visit: Payer: Self-pay

## 2021-01-29 DIAGNOSIS — Z86711 Personal history of pulmonary embolism: Secondary | ICD-10-CM

## 2021-01-29 NOTE — Telephone Encounter (Signed)
Requested medications are due for refill today.  yes  Requested medications are on the active medications list.  yes  Last refill. 01/04/2021  Future visit scheduled.   no  Notes to clinic.  Per note from 10/23/2020 appointment, Pt was to return for follow up around 01/23/2021

## 2021-02-01 ENCOUNTER — Other Ambulatory Visit: Payer: Self-pay | Admitting: Nurse Practitioner

## 2021-02-01 ENCOUNTER — Other Ambulatory Visit: Payer: Self-pay

## 2021-02-01 DIAGNOSIS — B009 Herpesviral infection, unspecified: Secondary | ICD-10-CM

## 2021-02-01 MED ORDER — APIXABAN 5 MG PO TABS
5.0000 mg | ORAL_TABLET | Freq: Two times a day (BID) | ORAL | 0 refills | Status: DC
  Filled 2021-02-01: qty 60, 30d supply, fill #0

## 2021-02-01 MED ORDER — VALACYCLOVIR HCL 1 G PO TABS
ORAL_TABLET | ORAL | 3 refills | Status: DC
  Filled 2021-02-01: qty 5, 5d supply, fill #0

## 2021-02-01 NOTE — Telephone Encounter (Signed)
Requested medication (s) are due for refill today: Yes  Requested medication (s) are on the active medication list: Yes  Last refill:  10/23/20 #5/3 RF  Future visit scheduled: Yes  Notes to clinic:  pt requesting refill, unsure if pt needed to be seen in office or not. Please advise,      Requested Prescriptions  Pending Prescriptions Disp Refills   valACYclovir (VALTREX) 1000 MG tablet 5 tablet 3    Sig: TAKE 1 TABLET (1,000 MG TOTAL) BY MOUTH DAILY FOR 5 DAYS.     Antimicrobials:  Antiviral Agents - Anti-Herpetic Passed - 02/01/2021 10:41 AM      Passed - Valid encounter within last 12 months    Recent Outpatient Visits           3 months ago Hospital discharge follow-up   [facility] Well[facility] [place], [place] W, NP   4 months ago Chronic bilateral low back pain without sciatica   [facility] And Wellness Claiborne Rigg, NP   9 months ago Encounter to establish care   [facility] [place], Shea Stakes, NP       Future Appointments             In 3 weeks Claiborne Rigg, NP [employer] And Wellness

## 2021-02-01 NOTE — Telephone Encounter (Signed)
Medication Refill - Medication:  valACYclovir (VALTREX) 1000 MG tablet   Has the patient contacted their pharmacy? Yes.   Contact PCP  Preferred Pharmacy (with phone number or street name):  [facility]  [address], [place] [address]  Phone:  [phone]  Fax:  [phone]  Has the patient been seen for an appointment in the last year OR does the patient have an upcoming appointment? Yes.    Agent: Please be advised that RX refills may take up to 3 business days. We ask that you follow-up with your pharmacy.

## 2021-02-04 ENCOUNTER — Other Ambulatory Visit: Payer: Self-pay

## 2021-02-11 ENCOUNTER — Other Ambulatory Visit: Payer: Self-pay

## 2021-02-11 ENCOUNTER — Other Ambulatory Visit (HOSPITAL_BASED_OUTPATIENT_CLINIC_OR_DEPARTMENT_OTHER): Payer: Self-pay

## 2021-02-11 MED ORDER — MODERNA COVID-19 BIVAL BOOSTER 50 MCG/0.5ML IM SUSP
INTRAMUSCULAR | 0 refills | Status: AC
  Filled 2021-02-11: qty 0.5, 1d supply, fill #0

## 2021-02-15 ENCOUNTER — Telehealth: Payer: Self-pay | Admitting: Nurse Practitioner

## 2021-02-15 ENCOUNTER — Other Ambulatory Visit: Payer: Self-pay

## 2021-02-15 DIAGNOSIS — K089 Disorder of teeth and supporting structures, unspecified: Secondary | ICD-10-CM

## 2021-02-15 NOTE — Telephone Encounter (Signed)
Copied from CRM [id number]. Topic: General - Other >> Feb 14, 2021  9:59 AM Roger Hurley wrote: Reason for CRM: Pt requests call back to advise of a dentist to extract his tooth. Pt stated he is in so much pain he was not able to sleep last night. Pt request call back asap
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
## 2021-02-15 NOTE — Telephone Encounter (Signed)
Referral was made for a dentist. Someone will be in touch.

## 2021-02-21 ENCOUNTER — Telehealth: Payer: Self-pay | Admitting: Nurse Practitioner

## 2021-02-21 NOTE — Telephone Encounter (Signed)
Needs a referral to see the dentist say he has a bad toothache

## 2021-02-25 ENCOUNTER — Ambulatory Visit: Payer: Self-pay

## 2021-02-25 ENCOUNTER — Other Ambulatory Visit: Payer: Self-pay

## 2021-02-25 ENCOUNTER — Encounter: Payer: Self-pay | Admitting: Nurse Practitioner

## 2021-02-25 ENCOUNTER — Ambulatory Visit: Payer: Self-pay | Attending: Nurse Practitioner | Admitting: Nurse Practitioner

## 2021-02-25 VITALS — BP 119/76 | HR 64 | Ht 74.0 in | Wt 208.1 lb

## 2021-02-25 DIAGNOSIS — Z91013 Allergy to seafood: Secondary | ICD-10-CM

## 2021-02-25 DIAGNOSIS — Z86711 Personal history of pulmonary embolism: Secondary | ICD-10-CM

## 2021-02-25 DIAGNOSIS — B009 Herpesviral infection, unspecified: Secondary | ICD-10-CM

## 2021-02-25 DIAGNOSIS — R7309 Other abnormal glucose: Secondary | ICD-10-CM

## 2021-02-25 DIAGNOSIS — G8929 Other chronic pain: Secondary | ICD-10-CM

## 2021-02-25 DIAGNOSIS — R21 Rash and other nonspecific skin eruption: Secondary | ICD-10-CM

## 2021-02-25 DIAGNOSIS — M545 Low back pain, unspecified: Secondary | ICD-10-CM

## 2021-02-25 MED ORDER — DULOXETINE HCL 60 MG PO CPEP
60.0000 mg | ORAL_CAPSULE | Freq: Every day | ORAL | 1 refills | Status: AC
  Filled 2021-02-25 – 2021-03-04 (×2): qty 30, 30d supply, fill #0

## 2021-02-25 MED ORDER — HYDROCORTISONE 2.5 % EX CREA
TOPICAL_CREAM | Freq: Two times a day (BID) | CUTANEOUS | 2 refills | Status: AC
  Filled 2021-02-25: qty 60, 30d supply, fill #0
  Filled 2021-03-04: qty 60, 15d supply, fill #0

## 2021-02-25 MED ORDER — APIXABAN 5 MG PO TABS
5.0000 mg | ORAL_TABLET | Freq: Two times a day (BID) | ORAL | 3 refills | Status: AC
  Filled 2021-02-25: qty 180, 90d supply, fill #0
  Filled 2021-03-04: qty 60, 30d supply, fill #0
  Filled 2021-03-29: qty 180, 90d supply, fill #0

## 2021-02-25 MED ORDER — EPINEPHRINE 0.3 MG/0.3ML IJ SOAJ
0.3000 mg | INTRAMUSCULAR | 1 refills | Status: AC | PRN
  Filled 2021-02-25: qty 2, 30d supply, fill #0
  Filled 2021-03-04: qty 1, 1d supply, fill #0

## 2021-02-25 MED ORDER — VALACYCLOVIR HCL 1 G PO TABS
ORAL_TABLET | ORAL | 3 refills | Status: AC
  Filled 2021-02-25 – 2021-03-26 (×3): qty 5, 5d supply, fill #0
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
## 2021-02-26 LAB — CMP14+EGFR
ALT: 44 IU/L (ref 0–44)
AST: 27 IU/L (ref 0–40)
Albumin/Globulin Ratio: 1.8 (ref 1.2–2.2)
Albumin: 4.6 g/dL (ref 4.0–5.0)
Alkaline Phosphatase: 72 IU/L (ref 44–121)
BUN/Creatinine Ratio: 12 (ref 9–20)
BUN: 12 mg/dL (ref 6–24)
Bilirubin Total: <0.2 mg/dL (ref 0.0–1.2)
CO2: 29 mmol/L (ref 20–29)
Calcium: 9.8 mg/dL (ref 8.7–10.2)
Chloride: 97 mmol/L (ref 96–106)
Creatinine, Ser: 0.99 mg/dL (ref 0.76–1.27)
Globulin, Total: 2.5 g/dL (ref 1.5–4.5)
Glucose: 87 mg/dL (ref 70–99)
Potassium: 4.9 mmol/L (ref 3.5–5.2)
Sodium: 136 mmol/L (ref 134–144)
Total Protein: 7.1 g/dL (ref 6.0–8.5)
eGFR: 94 mL/min/{1.73_m2} (ref >59)

## 2021-02-28 ENCOUNTER — Other Ambulatory Visit: Payer: Self-pay

## 2021-03-04 ENCOUNTER — Other Ambulatory Visit: Payer: Self-pay

## 2021-03-05 ENCOUNTER — Other Ambulatory Visit: Payer: Self-pay

## 2021-03-06 ENCOUNTER — Encounter (HOSPITAL_BASED_OUTPATIENT_CLINIC_OR_DEPARTMENT_OTHER): Payer: Self-pay | Admitting: *Deleted

## 2021-03-06 ENCOUNTER — Other Ambulatory Visit: Payer: Self-pay

## 2021-03-06 ENCOUNTER — Emergency Department (HOSPITAL_BASED_OUTPATIENT_CLINIC_OR_DEPARTMENT_OTHER)
Admission: EM | Admit: 2021-03-06 | Discharge: 2021-03-07 | Disposition: A | Discharge status: Home / Self Care | Payer: Self-pay | Attending: Emergency Medicine | Admitting: Emergency Medicine

## 2021-03-06 DIAGNOSIS — F17210 Nicotine dependence, cigarettes, uncomplicated: Secondary | ICD-10-CM | POA: Insufficient documentation

## 2021-03-06 DIAGNOSIS — Z7901 Long term (current) use of anticoagulants: Secondary | ICD-10-CM | POA: Insufficient documentation

## 2021-03-06 DIAGNOSIS — K529 Noninfective gastroenteritis and colitis, unspecified: Secondary | ICD-10-CM

## 2021-03-06 DIAGNOSIS — R197 Diarrhea, unspecified: Secondary | ICD-10-CM | POA: Insufficient documentation

## 2021-03-06 DIAGNOSIS — Z79899 Other long term (current) drug therapy: Secondary | ICD-10-CM | POA: Insufficient documentation

## 2021-03-06 DIAGNOSIS — Z20822 Contact with and (suspected) exposure to covid-19: Secondary | ICD-10-CM | POA: Insufficient documentation

## 2021-03-06 LAB — RESP PANEL BY RT-PCR (FLU A&B, COVID) ARPGX2
Influenza A by PCR: NEGATIVE
Influenza B by PCR: NEGATIVE
SARS Coronavirus 2 by RT PCR: NEGATIVE

## 2021-03-06 MED ORDER — KETOROLAC TROMETHAMINE 30 MG/ML IJ SOLN
30.0000 mg | Freq: Once | INTRAMUSCULAR | Status: AC
  Administered 2021-03-06: 30 mg via INTRAVENOUS
  Filled 2021-03-06: qty 1

## 2021-03-06 MED ORDER — SODIUM CHLORIDE 0.9 % IV BOLUS
1000.0000 mL | Freq: Once | INTRAVENOUS | Status: AC
  Administered 2021-03-06: 1000 mL via INTRAVENOUS

## 2021-03-06 MED ORDER — ONDANSETRON HCL 4 MG/2ML IJ SOLN
4.0000 mg | Freq: Once | INTRAMUSCULAR | Status: AC
  Administered 2021-03-06: 4 mg via INTRAVENOUS
  Filled 2021-03-06: qty 2
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
## 2021-03-06 NOTE — ED Triage Notes (Signed)
C/o n/v/d fatigue x 1 week

## 2021-03-07 LAB — CBC WITH DIFFERENTIAL/PLATELET
Abs Immature Granulocytes: 0.03 10*3/uL (ref 0.00–0.07)
Basophils Absolute: 0.1 10*3/uL (ref 0.0–0.1)
Basophils Relative: 1 %
Eosinophils Absolute: 0.3 10*3/uL (ref 0.0–0.5)
Eosinophils Relative: 3 %
HCT: 44.6 % (ref 39.0–52.0)
Hemoglobin: 15.6 g/dL (ref 13.0–17.0)
Immature Granulocytes: 0 %
Lymphocytes Relative: 25 %
Lymphs Abs: 2.7 10*3/uL (ref 0.7–4.0)
MCH: 31.6 pg (ref 26.0–34.0)
MCHC: 35 g/dL (ref 30.0–36.0)
MCV: 90.3 fL (ref 80.0–100.0)
Monocytes Absolute: 0.9 10*3/uL (ref 0.1–1.0)
Monocytes Relative: 9 %
Neutro Abs: 7 10*3/uL (ref 1.7–7.7)
Neutrophils Relative %: 62 %
Platelets: 300 10*3/uL (ref 150–400)
RBC: 4.94 MIL/uL (ref 4.22–5.81)
RDW: 13.5 % (ref 11.5–15.5)
WBC: 11 10*3/uL — ABNORMAL HIGH (ref 4.0–10.5)
nRBC: 0 % (ref 0.0–0.2)

## 2021-03-07 LAB — COMPREHENSIVE METABOLIC PANEL
ALT: 23 U/L (ref 0–44)
AST: 13 U/L — ABNORMAL LOW (ref 15–41)
Albumin: 4 g/dL (ref 3.5–5.0)
Alkaline Phosphatase: 60 U/L (ref 38–126)
Anion gap: 8 (ref 5–15)
BUN: 10 mg/dL (ref 6–20)
CO2: 25 mmol/L (ref 22–32)
Calcium: 9.7 mg/dL (ref 8.9–10.3)
Chloride: 97 mmol/L — ABNORMAL LOW (ref 98–111)
Creatinine, Ser: 0.89 mg/dL (ref 0.61–1.24)
GFR, Estimated: >60 mL/min (ref >60)
Glucose, Bld: 110 mg/dL — ABNORMAL HIGH (ref 70–99)
Potassium: 4.4 mmol/L (ref 3.5–5.1)
Sodium: 130 mmol/L — ABNORMAL LOW (ref 135–145)
Total Bilirubin: 0.7 mg/dL (ref 0.3–1.2)
Total Protein: 7.6 g/dL (ref 6.5–8.1)

## 2021-03-07 LAB — LIPASE, BLOOD: Lipase: 57 U/L — ABNORMAL HIGH (ref 11–51)

## 2021-03-07 MED ORDER — ONDANSETRON 8 MG PO TBDP: ORAL_TABLET | ORAL | 0 refills | Status: AC

## 2021-03-07 NOTE — Discharge Instructions (Signed)
Take Zofran as prescribed as needed for nausea.  Clear liquids for the next 12 hours, then slowly advance diet as tolerated.  Return to the emergency department if symptoms significantly worsen or change.

## 2021-03-08 ENCOUNTER — Other Ambulatory Visit: Payer: Self-pay

## 2021-03-26 ENCOUNTER — Other Ambulatory Visit: Payer: Self-pay

## 2021-03-29 ENCOUNTER — Other Ambulatory Visit (HOSPITAL_COMMUNITY): Payer: Self-pay

## 2021-03-29 ENCOUNTER — Other Ambulatory Visit: Payer: Self-pay

## 2021-04-15 ENCOUNTER — Encounter (HOSPITAL_BASED_OUTPATIENT_CLINIC_OR_DEPARTMENT_OTHER): Payer: Self-pay | Admitting: *Deleted

## 2021-04-15 ENCOUNTER — Encounter: Payer: Self-pay | Admitting: Nurse Practitioner

## 2021-04-15 ENCOUNTER — Other Ambulatory Visit: Payer: Self-pay

## 2021-04-15 ENCOUNTER — Emergency Department (HOSPITAL_BASED_OUTPATIENT_CLINIC_OR_DEPARTMENT_OTHER)
Admission: EM | Admit: 2021-04-15 | Discharge: 2021-04-15 | Disposition: A | Discharge status: Home / Self Care | Payer: Self-pay | Attending: Emergency Medicine | Admitting: Emergency Medicine

## 2021-04-15 DIAGNOSIS — T40601A Poisoning by unspecified narcotics, accidental (unintentional), initial encounter: Secondary | ICD-10-CM

## 2021-04-15 DIAGNOSIS — F17210 Nicotine dependence, cigarettes, uncomplicated: Secondary | ICD-10-CM | POA: Insufficient documentation

## 2021-04-15 DIAGNOSIS — Z7901 Long term (current) use of anticoagulants: Secondary | ICD-10-CM | POA: Insufficient documentation

## 2021-04-15 MED ORDER — ONDANSETRON HCL 4 MG/2ML IJ SOLN
4.0000 mg | Freq: Once | INTRAMUSCULAR | Status: AC
  Administered 2021-04-15: 4 mg via INTRAVENOUS
  Filled 2021-04-15: qty 2

## 2021-04-15 NOTE — ED Triage Notes (Addendum)
Brought in by EMS for heroin/fentanyl  overdose ,  found unresp. By mother , given Narcan 1 mg given iv PTA by EMS , arrived alert oriented x 4
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
## 2021-04-29 ENCOUNTER — Other Ambulatory Visit: Payer: Self-pay

## 2021-05-17 ENCOUNTER — Other Ambulatory Visit: Payer: Self-pay

## 2021-05-21 ENCOUNTER — Other Ambulatory Visit: Payer: Self-pay

## 2021-08-26 ENCOUNTER — Ambulatory Visit: Payer: Self-pay | Admitting: Nurse Practitioner

## 2021-09-16 ENCOUNTER — Other Ambulatory Visit: Payer: Self-pay

## 2021-09-24 ENCOUNTER — Telehealth: Payer: Self-pay | Admitting: Emergency Medicine

## 2021-09-24 NOTE — Telephone Encounter (Signed)
Copied from CRM [id number]. Topic: General - Other >> 09-28-21  4:56 PM Runell Gess P wrote: Reason for CRM: Patients mother called saying she found her son dead this morning. Carlen  is his mother...[phone]    FYI!!

## 2021-10-15 DEATH — deceased

## 2021-12-17 ENCOUNTER — Other Ambulatory Visit: Payer: Self-pay

## 2022-12-20 IMAGING — CT CT ANGIO CHEST
2 of 8 series · 18 of 37 positions shown · IV contrast (Omnipaque)
Comparison: 04/05/2020 and portable chest obtained earlier today.

CLINICAL DATA: Shortness of breath, weakness and chills for the
past week. Taking Eliquis for bilateral pulmonary embolism diagnosed
on 04/05/2020.

EXAM:
CT ANGIOGRAPHY CHEST WITH CONTRAST
TECHNIQUE: Multidetector CT imaging of the chest was performed using the
standard protocol during bolus administration of intravenous
contrast. Multiplanar CT image reconstructions and MIPs were
obtained to evaluate the vascular anatomy.
CONTRAST:  100mL OMNIPAQUE IOHEXOL 350 MG/ML SOLN

[Series 6: pe thins · axial · 0.76mm/px · z∈[+701,+985]mm · 15 of 318 slices shown]
[im 17/318  lung]
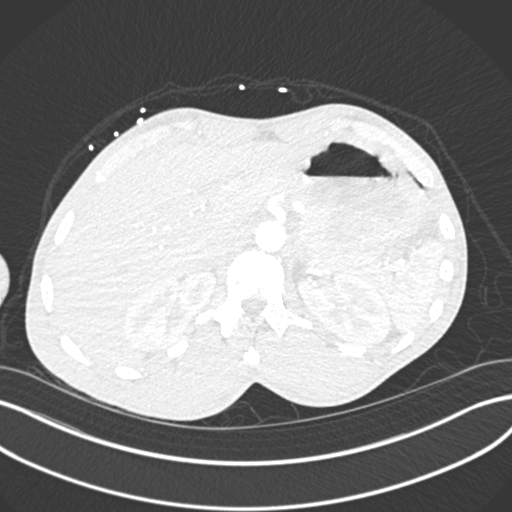
[im 34/318  mediastinal]
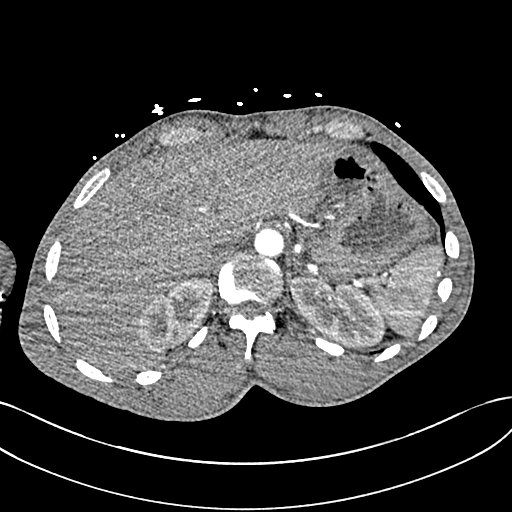
[im 67/318  lung]
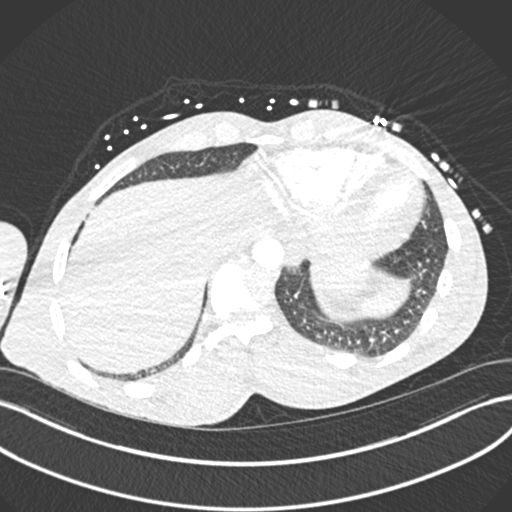
[im 84/318  mediastinal]
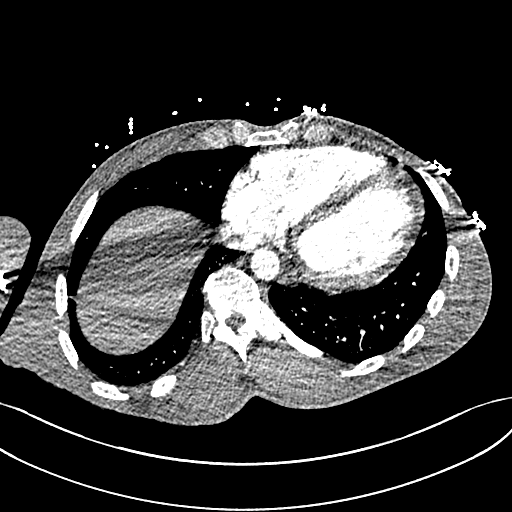
[im 101/318  lung]
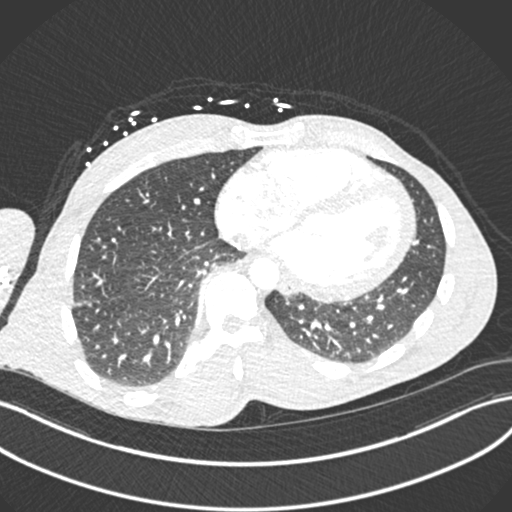
[im 117/318  mediastinal]
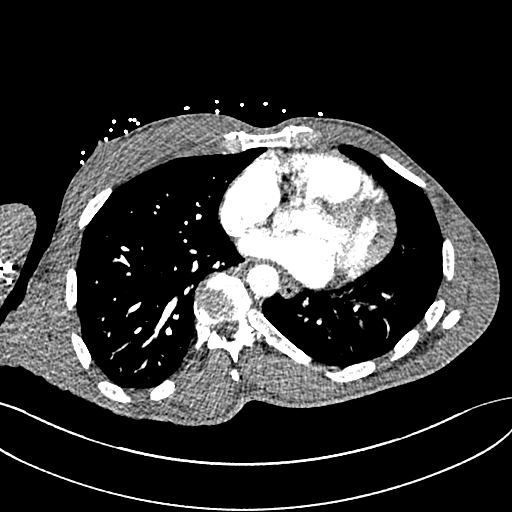
[im 134/318  lung]
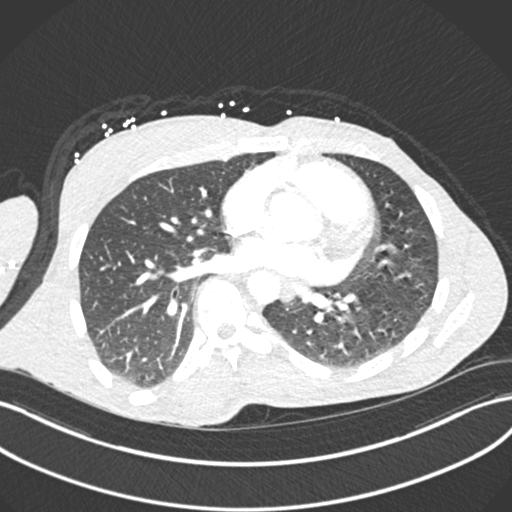
[im 167/318  mediastinal]
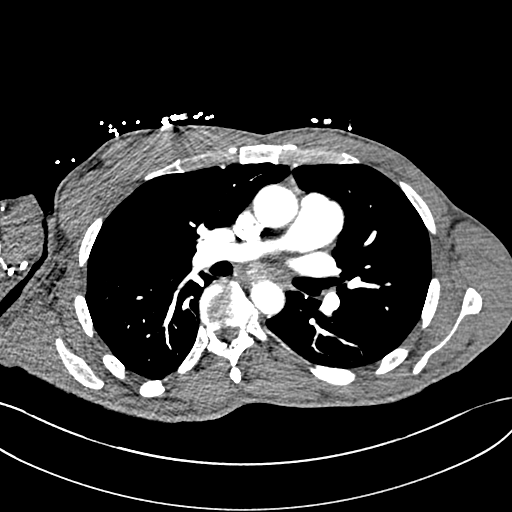
[im 184/318  lung]
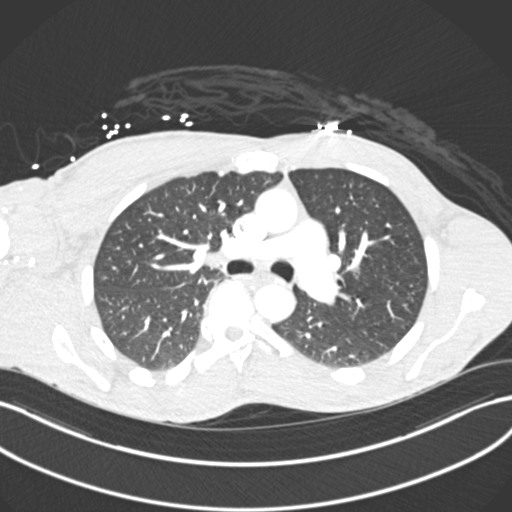
[im 201/318  mediastinal]
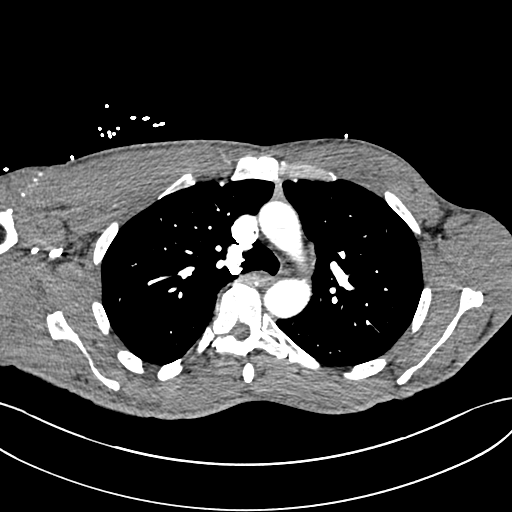
[im 217/318  lung]
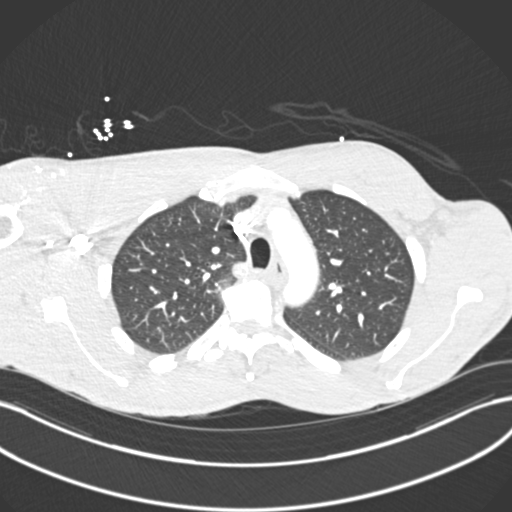
[im 234/318  mediastinal]
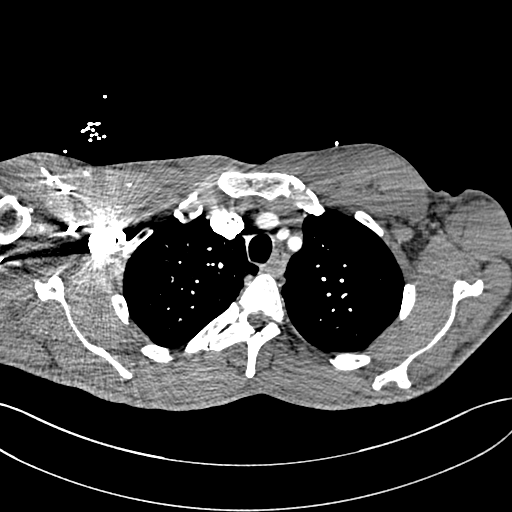
[im 267/318  lung]
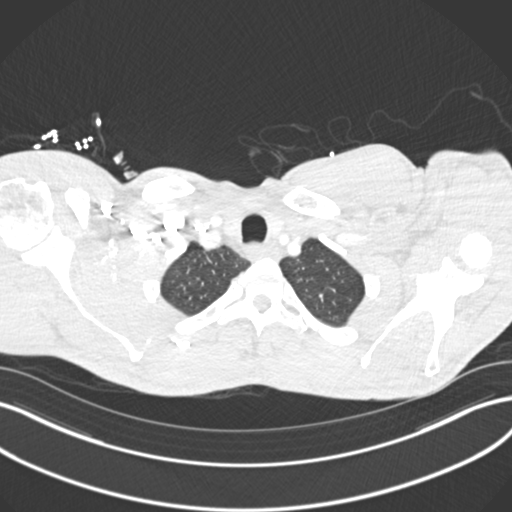
[im 284/318  mediastinal]
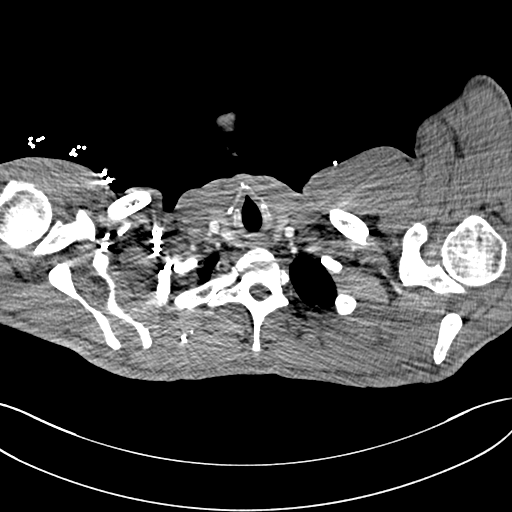
[im 301/318  lung]
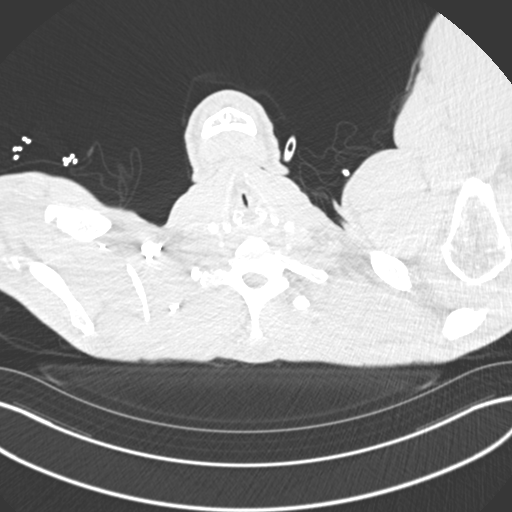

[Series 7: pe coronal mpr · coronal · 0.66mm/px · 3 of 123 slices shown]
[im 25/123  mediastinal]
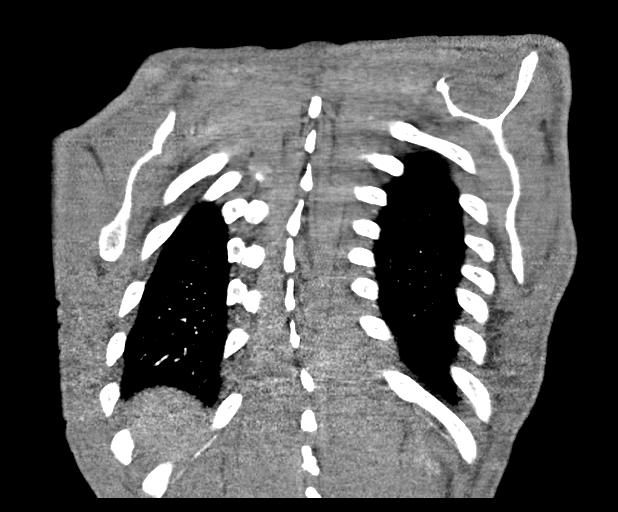
[im 49/123  mediastinal]
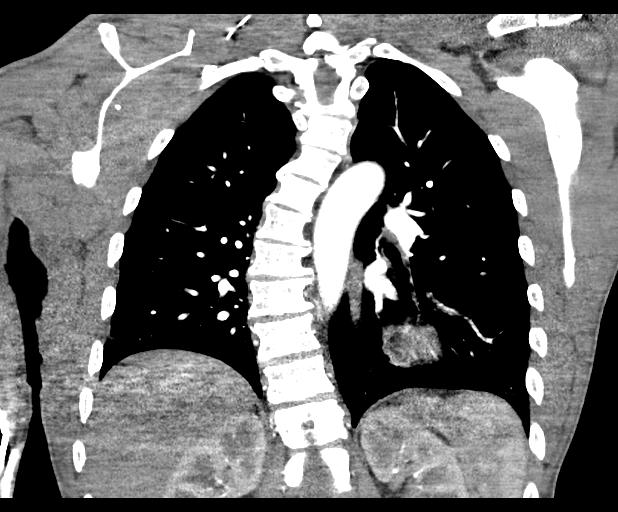
[im 74/123  mediastinal]
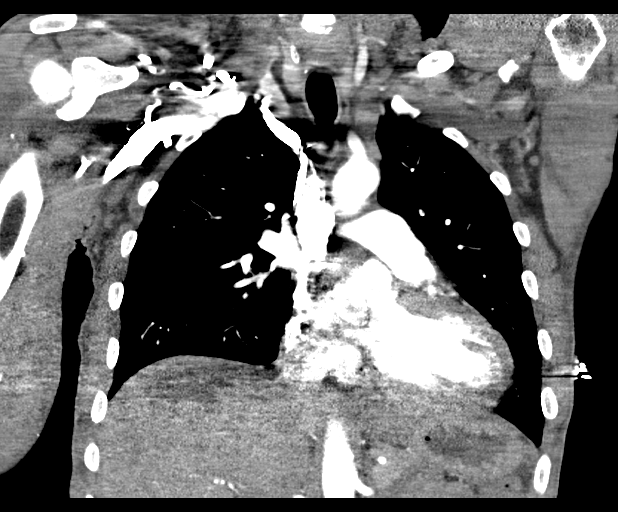

[18 of 37 positions shown; findings below may reference images not displayed]

FINDINGS: Cardiovascular: Satisfactory opacification of the pulmonary arteries
to the segmental level. No evidence of pulmonary embolism. Normal
heart size. No pericardial effusion.

Mediastinum/Nodes: No enlarged mediastinal, hilar, or axillary lymph
nodes. Thyroid gland, trachea, and esophagus demonstrate no
significant findings.

Lungs/Pleura: Interval small amount of linear atelectasis or
scarring at the right lung base. No pleural fluid.

Upper Abdomen: Unremarkable.

Musculoskeletal: Stable moderate scoliosis and thoracic and lower
cervical spine degenerative changes.

Review of the MIP images confirms the above findings.
IMPRESSION: No pulmonary emboli or acute abnormality.

## 2022-12-20 IMAGING — DX DG CHEST 1V PORT
1 series · 2 of 2 positions shown · non-contrast
Comparison: Single-view of the chest 04/05/2020.

CLINICAL DATA: Shortness of breath, weakness and chills for 1 week.

EXAM:
PORTABLE CHEST 1 VIEW

[Series 1: chest ap · 0.14mm/px · 2 of 2 slices shown]
[im 1/2]
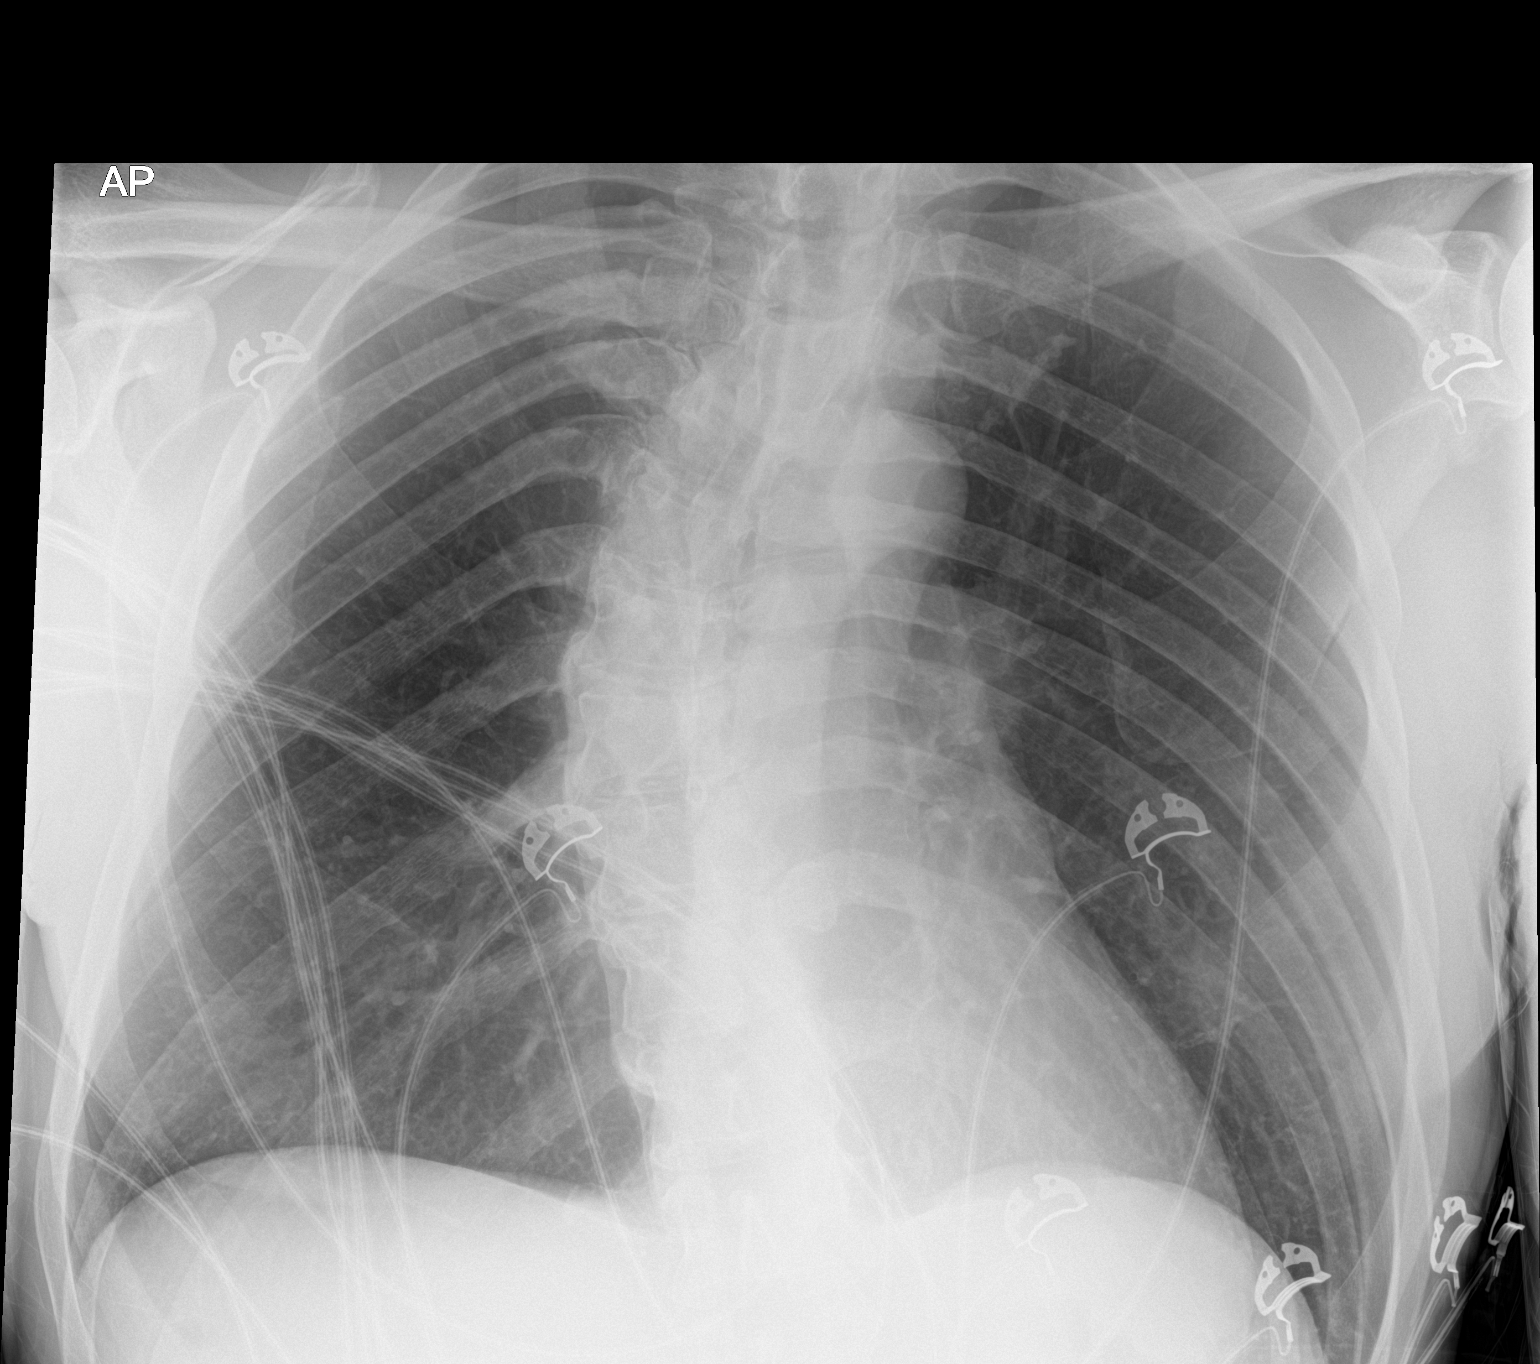
[im 2/2]
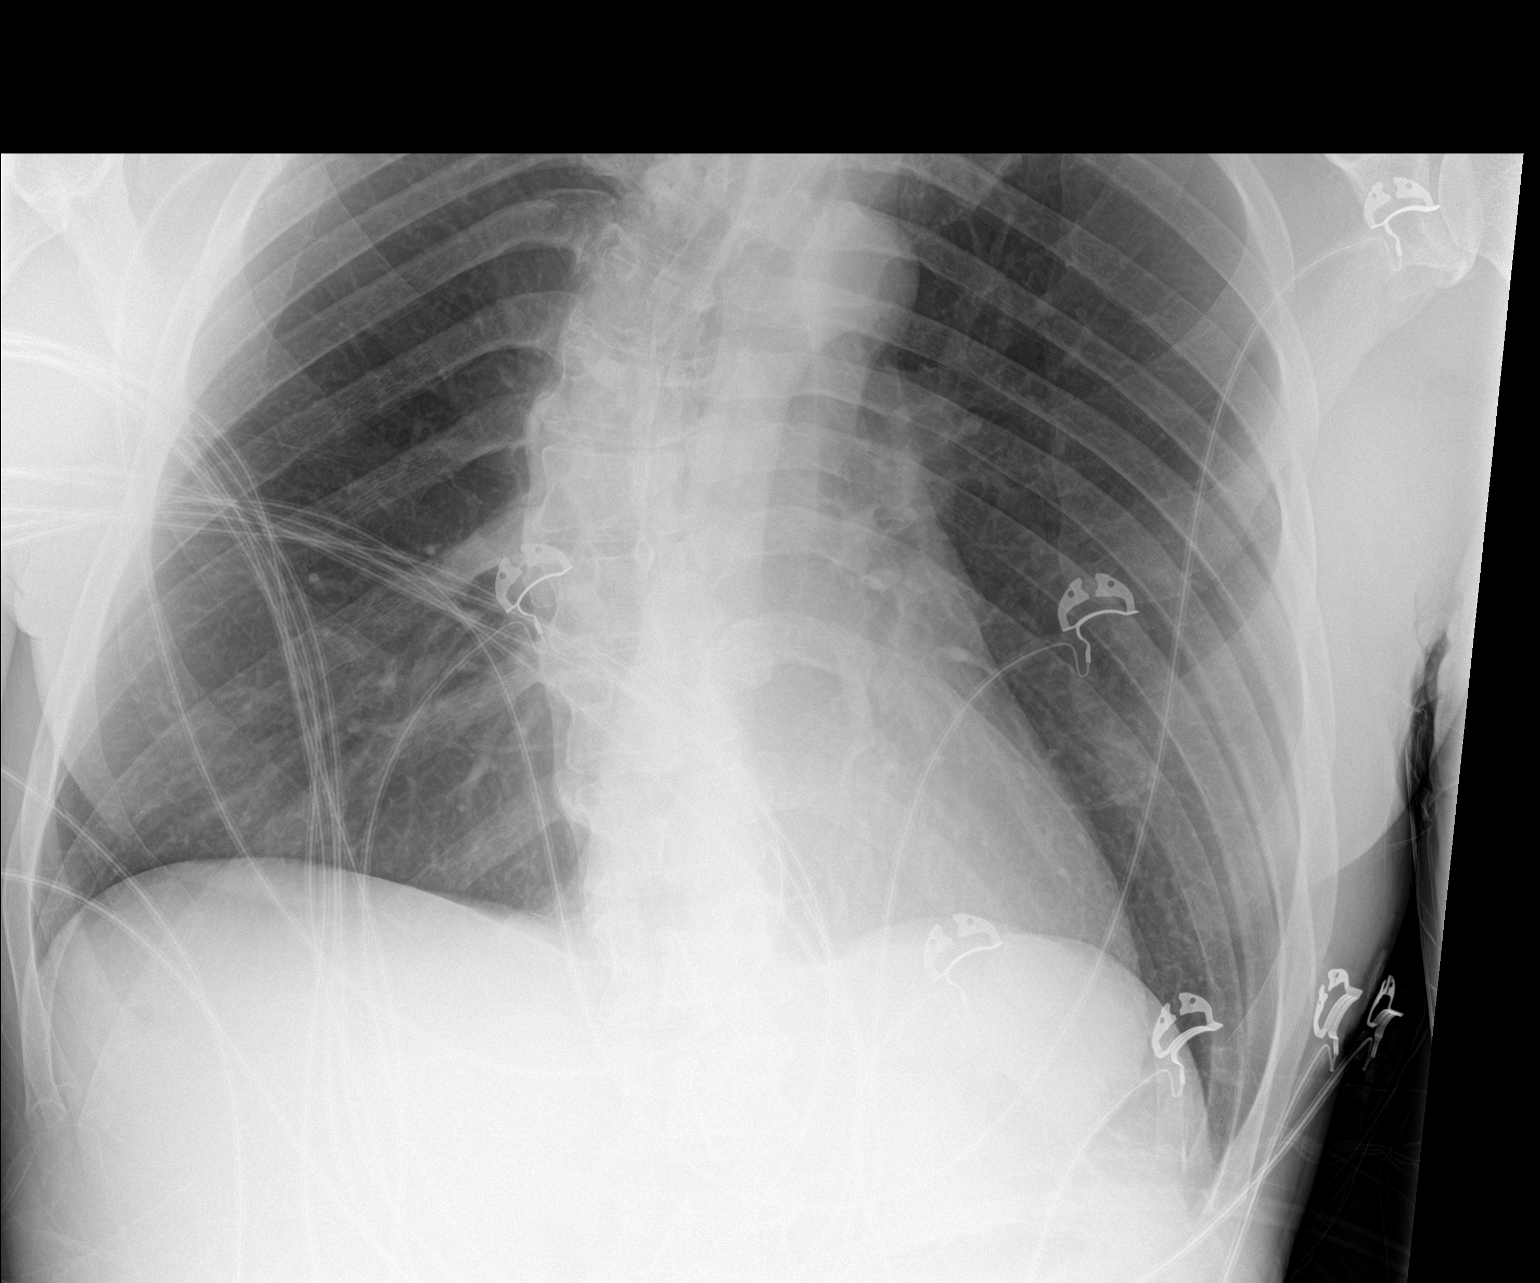

[2 of 2 positions shown; findings below may reference images not displayed]

FINDINGS: Lungs clear. Heart size normal. No pneumothorax or pleural effusion.
Thoracolumbar scoliosis again seen.
IMPRESSION: No acute disease.

## 2024-02-16 ENCOUNTER — Other Ambulatory Visit (HOSPITAL_BASED_OUTPATIENT_CLINIC_OR_DEPARTMENT_OTHER): Payer: Self-pay
